# Patient Record
Sex: Male | Born: 1955 | Race: White | Hispanic: No | Marital: Married | Smoking: Former smoker
Health system: Southern US, Community
[De-identification: ages and names within clinical notes are randomized; demographics above are authoritative.]

## PROBLEM LIST (undated history)

## (undated) DIAGNOSIS — M5416 Radiculopathy, lumbar region: Secondary | ICD-10-CM

## (undated) DIAGNOSIS — I10 Essential (primary) hypertension: Secondary | ICD-10-CM

## (undated) DIAGNOSIS — I2510 Atherosclerotic heart disease of native coronary artery without angina pectoris: Secondary | ICD-10-CM

## (undated) DIAGNOSIS — Z87442 Personal history of urinary calculi: Secondary | ICD-10-CM

## (undated) DIAGNOSIS — J449 Chronic obstructive pulmonary disease, unspecified: Secondary | ICD-10-CM

## (undated) DIAGNOSIS — I219 Acute myocardial infarction, unspecified: Secondary | ICD-10-CM

## (undated) DIAGNOSIS — G709 Myoneural disorder, unspecified: Secondary | ICD-10-CM

## (undated) DIAGNOSIS — E785 Hyperlipidemia, unspecified: Secondary | ICD-10-CM

## (undated) DIAGNOSIS — J189 Pneumonia, unspecified organism: Secondary | ICD-10-CM

## (undated) DIAGNOSIS — H9190 Unspecified hearing loss, unspecified ear: Secondary | ICD-10-CM

## (undated) DIAGNOSIS — N189 Chronic kidney disease, unspecified: Secondary | ICD-10-CM

## (undated) DIAGNOSIS — R0600 Dyspnea, unspecified: Secondary | ICD-10-CM

## (undated) DIAGNOSIS — M1990 Unspecified osteoarthritis, unspecified site: Secondary | ICD-10-CM

## (undated) DIAGNOSIS — K219 Gastro-esophageal reflux disease without esophagitis: Secondary | ICD-10-CM

## (undated) DIAGNOSIS — I723 Aneurysm of iliac artery: Secondary | ICD-10-CM

## (undated) DIAGNOSIS — I739 Peripheral vascular disease, unspecified: Secondary | ICD-10-CM

## (undated) DIAGNOSIS — D649 Anemia, unspecified: Secondary | ICD-10-CM

## (undated) DIAGNOSIS — C801 Malignant (primary) neoplasm, unspecified: Secondary | ICD-10-CM

## (undated) HISTORY — PX: ANGIOPLASTY: SHX39

## (undated) HISTORY — PX: EYE SURGERY: SHX253

## (undated) HISTORY — PX: BACK SURGERY: SHX140

## (undated) HISTORY — PX: TOTAL SHOULDER REPLACEMENT: SUR1217

## (undated) HISTORY — PX: REPLACEMENT TOTAL KNEE: SUR1224

## (undated) HISTORY — PX: HERNIA REPAIR: SHX51

## (undated) HISTORY — PX: CARPAL TUNNEL RELEASE: SHX101

## (undated) HISTORY — PX: WRIST FUSION: SHX839

---

## 2010-05-22 ENCOUNTER — Emergency Department (HOSPITAL_BASED_OUTPATIENT_CLINIC_OR_DEPARTMENT_OTHER): Admission: EM | Admit: 2010-05-22 | Discharge: 2010-05-22 | Payer: Self-pay | Admitting: Emergency Medicine

## 2010-05-24 ENCOUNTER — Emergency Department (HOSPITAL_BASED_OUTPATIENT_CLINIC_OR_DEPARTMENT_OTHER): Admission: EM | Admit: 2010-05-24 | Discharge: 2010-05-24 | Payer: Self-pay | Admitting: Emergency Medicine

## 2013-11-11 HISTORY — PX: JOINT REPLACEMENT: SHX530

## 2014-12-10 ENCOUNTER — Emergency Department (HOSPITAL_BASED_OUTPATIENT_CLINIC_OR_DEPARTMENT_OTHER): Payer: BLUE CROSS/BLUE SHIELD

## 2014-12-10 ENCOUNTER — Emergency Department (HOSPITAL_BASED_OUTPATIENT_CLINIC_OR_DEPARTMENT_OTHER)
Admission: EM | Admit: 2014-12-10 | Discharge: 2014-12-10 | Disposition: A | Discharge status: Home / Self Care | Payer: BLUE CROSS/BLUE SHIELD | Attending: Emergency Medicine | Admitting: Emergency Medicine

## 2014-12-10 DIAGNOSIS — Z791 Long term (current) use of non-steroidal anti-inflammatories (NSAID): Secondary | ICD-10-CM | POA: Insufficient documentation

## 2014-12-10 DIAGNOSIS — W1839XA Other fall on same level, initial encounter: Secondary | ICD-10-CM | POA: Diagnosis not present

## 2014-12-10 DIAGNOSIS — Y9389 Activity, other specified: Secondary | ICD-10-CM | POA: Insufficient documentation

## 2014-12-10 DIAGNOSIS — S6991XA Unspecified injury of right wrist, hand and finger(s), initial encounter: Secondary | ICD-10-CM | POA: Diagnosis present

## 2014-12-10 DIAGNOSIS — Y998 Other external cause status: Secondary | ICD-10-CM | POA: Insufficient documentation

## 2014-12-10 DIAGNOSIS — S60221A Contusion of right hand, initial encounter: Secondary | ICD-10-CM | POA: Insufficient documentation

## 2014-12-10 DIAGNOSIS — Y9289 Other specified places as the place of occurrence of the external cause: Secondary | ICD-10-CM | POA: Diagnosis not present

## 2014-12-10 MED ORDER — LIDOCAINE HCL 2 % IJ SOLN: 20.0000 mL | Freq: Once | INTRAMUSCULAR | Status: DC

## 2014-12-10 MED ORDER — HYDROCODONE-ACETAMINOPHEN 5-325 MG PO TABS
1.0000 | ORAL_TABLET | Freq: Once | ORAL | Status: AC
Start: 2014-12-10 — End: 2014-12-10
  Administered 2014-12-10: 1 via ORAL
  Filled 2014-12-10: qty 1

## 2014-12-10 MED ORDER — HYDROCODONE-ACETAMINOPHEN 5-325 MG PO TABS: ORAL_TABLET | ORAL | Status: DC

## 2014-12-10 NOTE — Discharge Instructions (Signed)
Rest, Ice intermittently (in the first 24-48 hours), Gentle compression with an Ace wrap, and elevate (Limb above the level of the heart)   Take up to 800mg  of ibuprofen (that is usually 4 over the counter pills)  3 times a day for 5 days. Take with food.  Take vicodin for breakthrough pain, do not drink alcohol, drive, care for children or do other critical tasks while taking vicodin.  Please follow with your primary care doctor in the next 2 days for a check-up. They must obtain records for further management.   Do not hesitate to return to the Emergency Department for any new, worsening or concerning symptoms.    Contusion A contusion is a deep bruise. Contusions happen when an injury causes bleeding under the skin. Signs of bruising include pain, puffiness (swelling), and discolored skin. The contusion may turn blue, purple, or yellow. HOME CARE   Put ice on the injured area.  Put ice in a plastic bag.  Place a towel between your skin and the bag.  Leave the ice on for 15-20 minutes, 03-04 times a day.  Only take medicine as told by your doctor.  Rest the injured area.  If possible, raise (elevate) the injured area to lessen puffiness. GET HELP RIGHT AWAY IF:   You have more bruising or puffiness.  You have pain that is getting worse.  Your puffiness or pain is not helped by medicine. MAKE SURE YOU:   Understand these instructions.  Will watch your condition.  Will get help right away if you are not doing well or get worse. Document Released: 04/15/2008 Document Revised: 01/20/2012 Document Reviewed: 09/02/2011 [facility] Patient Information 2015 [place], [place]. This information is not intended to replace advice given to you by your health care provider. Make sure you discuss any questions you have with your health care provider.

## 2014-12-10 NOTE — ED Notes (Signed)
Patient reports that he tripped over rock yesterday falling forward trying to catch self with right hand/wrist. Swelling and bruising noted

## 2014-12-10 NOTE — ED Provider Notes (Signed)
CSN: [id number]     Arrival date & time 12/10/14  1140 History   First MD Initiated Contact with Patient 12/10/14 1208     Chief Complaint  Patient presents with  . Hand Injury     (Consider location/radiation/quality/duration/timing/severity/associated sxs/prior Treatment) HPI   Roy Caldwell is a 59 y.o. male complaining of 8 out of 10 right hand pain status post mechanical slip and fall yesterday while tripping over a rock. Patient fell on outstretched hand. He denies any head trauma, LOC, cervicalgia he states that his low back which gives him chronic issues is slightly more uncomfortable than normal. Patient has been taking BC powder at home with little relief. Patient is right-hand-dominant. States pain is exacerbated by movement and palpation.  No past medical history on file. No past surgical history on file. No family history on file. History  Substance Use Topics  . Smoking status: Not on file  . Smokeless tobacco: Not on file  . Alcohol Use: Not on file    Review of Systems  10 systems reviewed and found to be negative, except as noted in the HPI.  Allergies  Review of patient's allergies indicates not on file.  Home Medications   Prior to Admission medications   Medication Sig Start Date End Date Taking? Authorizing Provider  diclofenac (CATAFLAM) 50 MG tablet Take 50 mg by mouth 2 (two) times daily.   Yes Historical Provider, MD  traMADol (ULTRAM) 50 MG tablet Take by mouth every 6 (six) hours as needed.   Yes Historical Provider, MD   BP 127/67 mmHg  Pulse 83  Temp(Src) 98.4 F (36.9 C) (Oral)  Resp 18  Wt 190 lb (86.183 kg)  SpO2 98% Physical Exam  Constitutional: He is oriented to person, place, and time. He appears well-developed and well-nourished. No distress.  HENT:  Head: Normocephalic and atraumatic.  Mouth/Throat: Oropharynx is clear and moist.  Eyes: Conjunctivae and EOM are normal. Pupils are equal, round, and reactive to light.  Neck:  Normal range of motion.  Cardiovascular: Normal rate, regular rhythm and intact distal pulses.   Pulmonary/Chest: Effort normal and breath sounds normal. No stridor. No respiratory distress. He has no wheezes. He has no rales. He exhibits no tenderness.  Abdominal: Soft. Bowel sounds are normal. He exhibits no distension and no mass. There is no tenderness. There is no rebound and no guarding.  Musculoskeletal: Normal range of motion. He exhibits edema and tenderness.  Patient has swelling and tenderness palpation along the mid shaft of the right third and fourth metacarpal. Excellent range of motion to wrist.  No snuffbox TTP. NVI  Neurological: He is alert and oriented to person, place, and time.  Psychiatric: He has a normal mood and affect.  Nursing note and vitals reviewed.   ED Course  Procedures (including critical care time) Labs Review Labs Reviewed - No data to display  Imaging Review Dg Wrist Complete Right  12/10/2014   CLINICAL DATA:  Tripped over rock, and injured right wrist. Posterior wrist pain and swelling. Initial encounter.  EXAM: RIGHT WRIST - COMPLETE 3+ VIEW  COMPARISON:  None.  FINDINGS: There is no evidence of fracture or dislocation. The carpal rows are intact, and demonstrate normal alignment. The joint spaces are preserved. Mild positive ulnar variance is noted.  Dorsal soft tissue swelling is noted at the wrist.  IMPRESSION: No evidence of fracture or dislocation. Mild positive ulnar variance is noted.   Electronically Signed   By: Beryle Beams.D.  On: 12/10/2014 12:08   Dg Hand Complete Right  12/10/2014   CLINICAL DATA:  Patient reports that he tripped over a rock yesterday falling forward trying to catch self with right hand/wrist. C/o posterior pain with swelling.  EXAM: RIGHT HAND - COMPLETE 3+ VIEW  COMPARISON:  None.  FINDINGS: No convincing acute fracture.  No dislocation.  There arthropathic changes most prominent at the third metacarpophalangeal joint.  There is marked joint space narrowing and marginal osteophytes with mild palmar subluxation of the proximal phalanx in relation to the third metacarpal head. On the lateral view there is some cortical irregularity which is felt most likely to be spurring. If there is point tenderness in this location, could potentially reflect a fracture.  Milder arthropathic changes are noted at the first, second and fourth metacarpophalangeal joints and minimally involving the third through fifth finger DIP joints.  Soft tissues are unremarkable.  IMPRESSION: 1. No definite acute fracture. Mild cortical irregularity along the dorsal margin of the third metacarpal head is felt most likely to be due to spurring. Consider nondisplaced fracture if there is point tenderness in this location. 2. Arthropathic changes as described most prominent at the third metacarpophalangeal joint.   Electronically Signed   By: Amie Portlandavid  Ormond M.D.   On: 12/10/2014 12:10     EKG Interpretation None      MDM   Final diagnoses:  Hand contusion, right, initial encounter    Filed Vitals:   12/10/14 1154  BP: 127/67  Pulse: 83  Temp: 98.4 F (36.9 C)  TempSrc: Oral  Resp: 18  Weight: 190 lb (86.183 kg)  SpO2: 98%    Medications  HYDROcodone-acetaminophen (NORCO/VICODIN) 5-325 MG per tablet 1 tablet (1 tablet Oral Given 12/10/14 1224)    Roy Caldwell is a pleasant 59 y.o. male presenting with dominant hand pain status post slip and fall yesterday. Patient is neurovascularly intact. X-ray with no definite findings. There is an abnormality on the x-ray but this is not in the area of his pain. Patient states that this is where he has chronic issues with arthritis. Patient will be put in a wrist splint that covers the area of abnormality on the x-ray, I have advised our ICD. I will have him follow with Dr. Butler DenmarkGrammig for checkup  Evaluation does not show pathology that would require ongoing emergent intervention or inpatient  treatment. Pt is hemodynamically stable and mentating appropriately. Discussed findings and plan with patient/guardian, who agrees with care plan. All questions answered. Return precautions discussed and outpatient follow up given.   New Prescriptions   HYDROCODONE-ACETAMINOPHEN (NORCO/VICODIN) 5-325 MG PER TABLET    Take 1-2 tablets by mouth every 6 hours as needed for pain and/or cough.         Wynetta Emeryicole Cianna Kasparian, PA-C 12/10/14 1246  Ethelda ChickMartha K Linker, MD 12/10/14 1247

## 2016-01-16 IMAGING — CR DG WRIST COMPLETE 3+V*R*
4 series · 4 of 4 positions shown · non-contrast
Comparison: None.

CLINICAL DATA: Tripped over rock, and injured right wrist.
Posterior wrist pain and swelling. Initial encounter.

EXAM:
RIGHT WRIST - COMPLETE 3+ VIEW

[x wrist pa right]
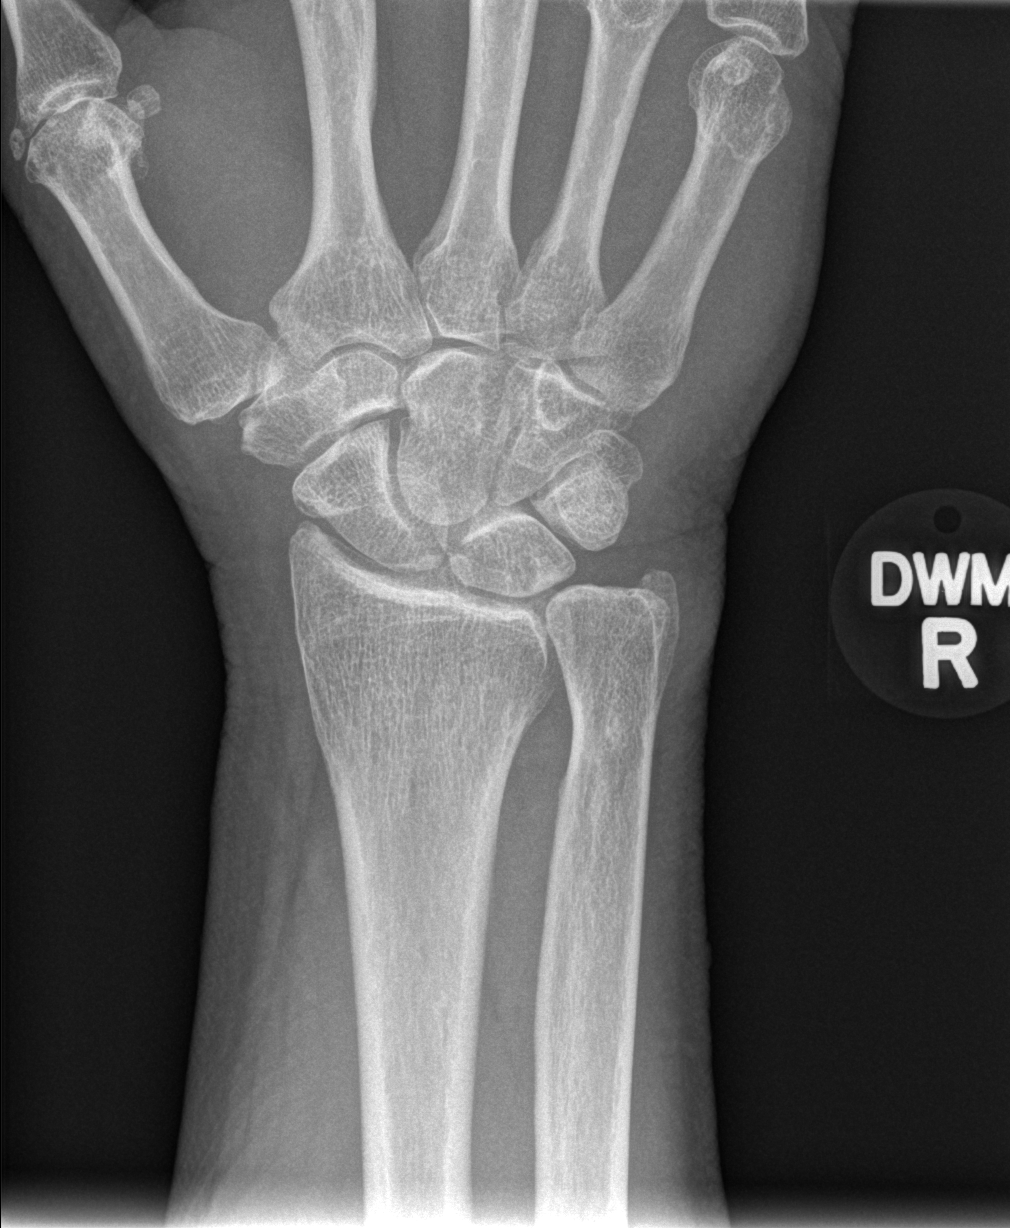

[x wrist obl right]
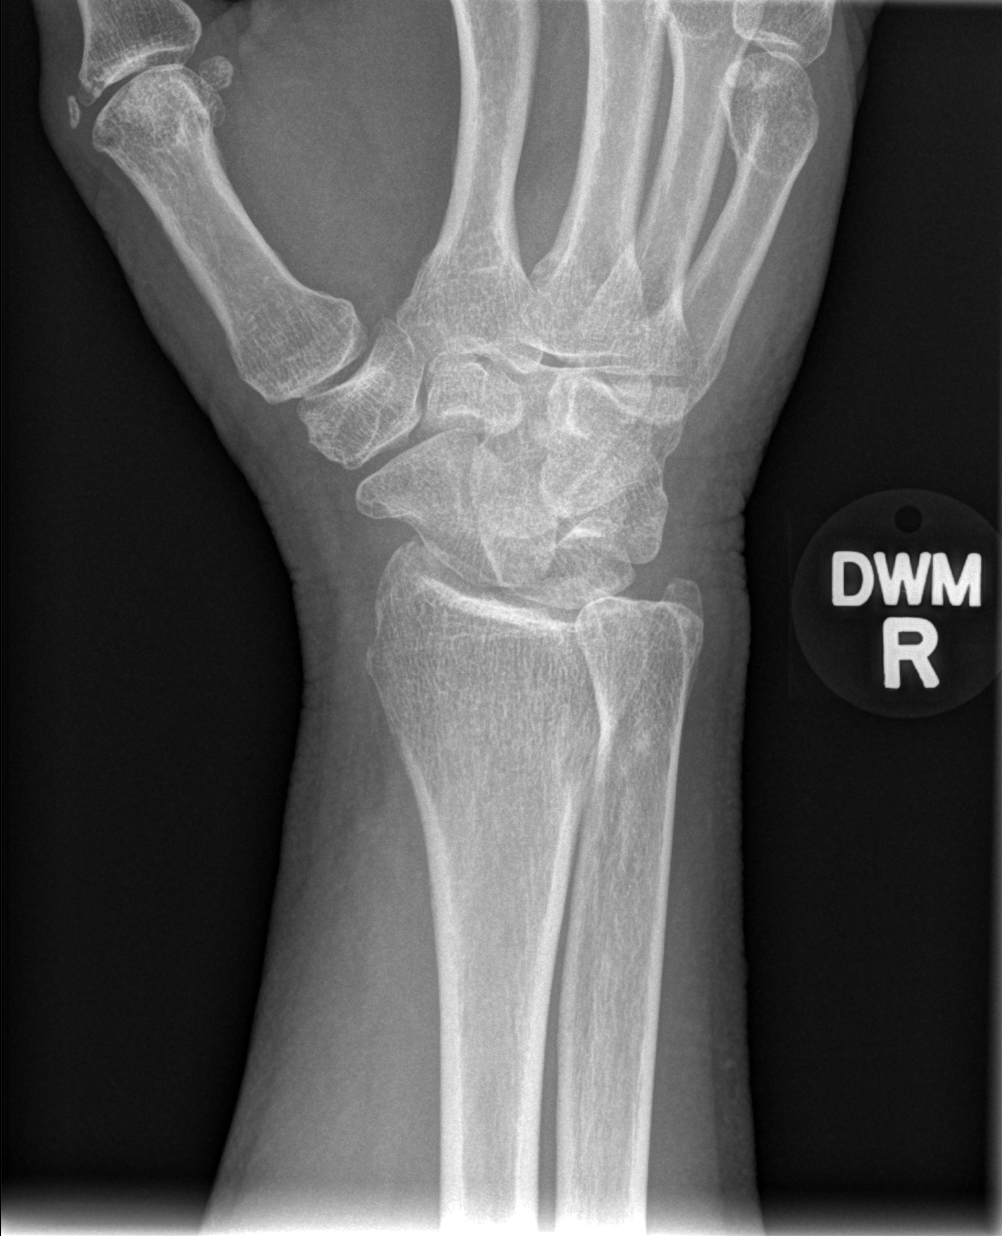

[x wrist lat right]
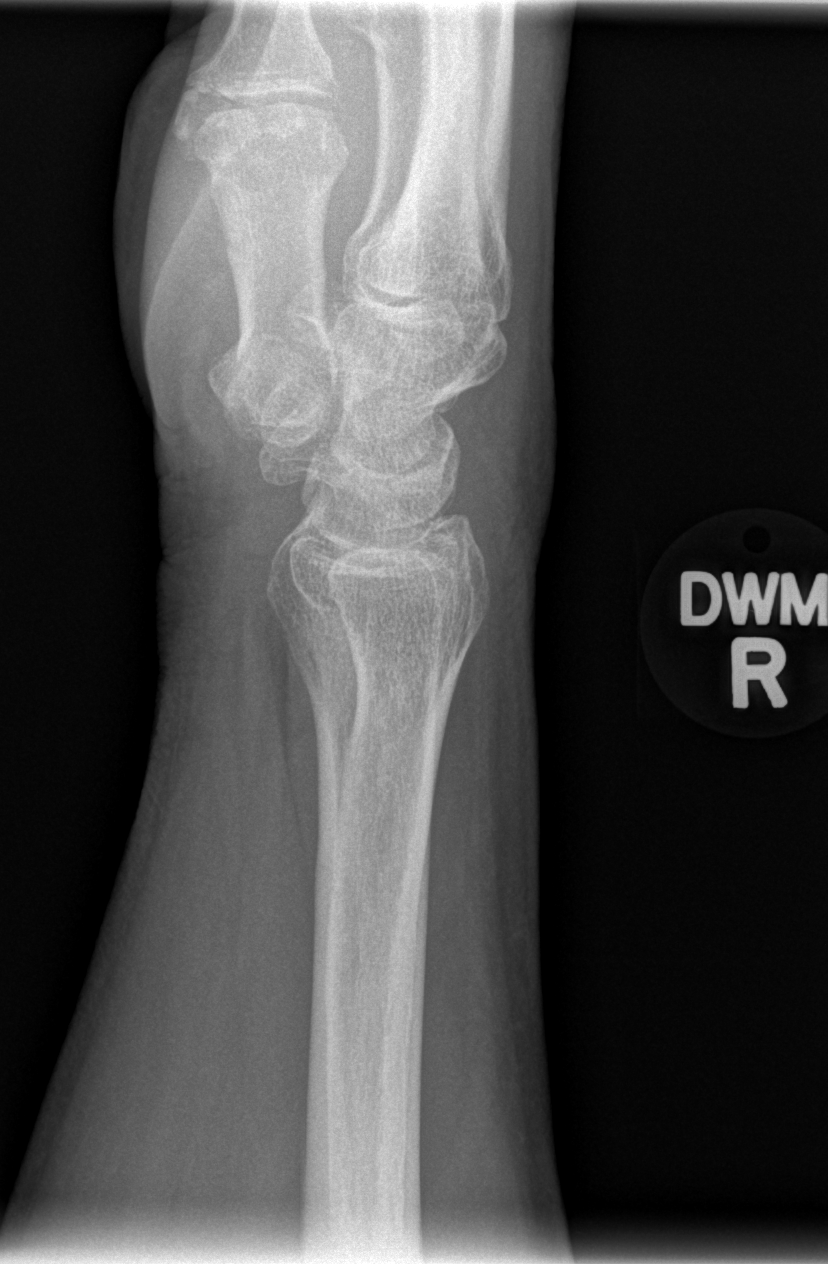

[x navicular]
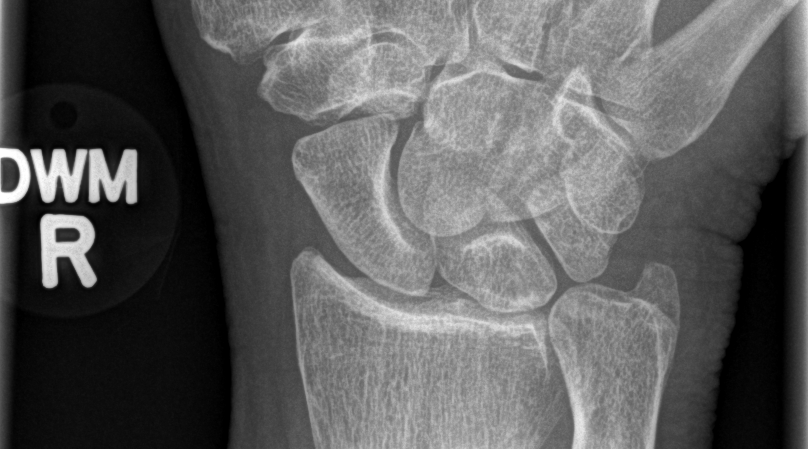

[4 of 4 positions shown; findings below may reference images not displayed]

FINDINGS: There is no evidence of fracture or dislocation. The carpal rows are
intact, and demonstrate normal alignment. The joint spaces are
preserved. Mild positive ulnar variance is noted.

Dorsal soft tissue swelling is noted at the wrist.
IMPRESSION: No evidence of fracture or dislocation. Mild positive ulnar variance
is noted.

## 2017-02-11 ENCOUNTER — Other Ambulatory Visit: Payer: Self-pay | Admitting: Neurosurgery

## 2017-02-12 ENCOUNTER — Other Ambulatory Visit: Payer: Self-pay | Admitting: Neurosurgery

## 2017-02-12 DIAGNOSIS — M533 Sacrococcygeal disorders, not elsewhere classified: Secondary | ICD-10-CM

## 2017-02-13 ENCOUNTER — Ambulatory Visit (INDEPENDENT_AMBULATORY_CARE_PROVIDER_SITE_OTHER): Payer: BLUE CROSS/BLUE SHIELD

## 2017-02-13 DIAGNOSIS — M533 Sacrococcygeal disorders, not elsewhere classified: Secondary | ICD-10-CM

## 2017-02-17 ENCOUNTER — Ambulatory Visit (INDEPENDENT_AMBULATORY_CARE_PROVIDER_SITE_OTHER): Payer: BLUE CROSS/BLUE SHIELD

## 2017-02-17 ENCOUNTER — Other Ambulatory Visit: Payer: BLUE CROSS/BLUE SHIELD

## 2017-02-17 DIAGNOSIS — M48061 Spinal stenosis, lumbar region without neurogenic claudication: Secondary | ICD-10-CM | POA: Diagnosis not present

## 2017-02-17 DIAGNOSIS — M533 Sacrococcygeal disorders, not elsewhere classified: Secondary | ICD-10-CM

## 2017-02-17 MED ORDER — GADOBENATE DIMEGLUMINE 529 MG/ML IV SOLN
20.0000 mL | Freq: Once | INTRAVENOUS | Status: AC | PRN
  Administered 2017-02-17: 18 mL via INTRAVENOUS

## 2017-02-19 ENCOUNTER — Other Ambulatory Visit: Payer: Self-pay | Admitting: Neurosurgery

## 2017-02-19 DIAGNOSIS — M533 Sacrococcygeal disorders, not elsewhere classified: Secondary | ICD-10-CM

## 2017-03-10 ENCOUNTER — Ambulatory Visit (INDEPENDENT_AMBULATORY_CARE_PROVIDER_SITE_OTHER): Payer: BLUE CROSS/BLUE SHIELD

## 2017-03-10 DIAGNOSIS — M533 Sacrococcygeal disorders, not elsewhere classified: Secondary | ICD-10-CM | POA: Diagnosis not present

## 2017-03-10 DIAGNOSIS — N400 Enlarged prostate without lower urinary tract symptoms: Secondary | ICD-10-CM

## 2017-10-08 MED ORDER — ONDANSETRON HCL 4 MG/2ML IJ SOLN
4.00 mg | INTRAMUSCULAR | Status: DC
Start: ? — End: 2017-10-08

## 2017-10-08 MED ORDER — HYDROCODONE-ACETAMINOPHEN 5-325 MG PO TABS
1.00 | ORAL_TABLET | ORAL | Status: DC
Start: ? — End: 2017-10-08

## 2017-10-08 MED ORDER — OXYCODONE-ACETAMINOPHEN 5-325 MG PO TABS
2.00 | ORAL_TABLET | ORAL | Status: DC
Start: ? — End: 2017-10-08

## 2017-10-08 MED ORDER — GENERIC EXTERNAL MEDICATION
1.00 | Status: DC
Start: ? — End: 2017-10-08

## 2017-10-08 MED ORDER — ALUMINUM-MAGNESIUM-SIMETHICONE 200-200-20 MG/5ML PO SUSP
30.00 mL | ORAL | Status: DC
Start: ? — End: 2017-10-08

## 2017-10-08 MED ORDER — SODIUM CHLORIDE 0.9 % IV SOLN
INTRAVENOUS | Status: DC
Start: ? — End: 2017-10-08

## 2017-10-08 MED ORDER — BISACODYL 10 MG RE SUPP
10.00 mg | RECTAL | Status: DC
Start: ? — End: 2017-10-08

## 2017-10-08 MED ORDER — ASPIRIN 81 MG PO CHEW
81.00 mg | CHEWABLE_TABLET | ORAL | Status: DC
Start: 2017-10-09 — End: 2017-10-08

## 2017-10-08 MED ORDER — CYCLOBENZAPRINE HCL 10 MG PO TABS
5.00 mg | ORAL_TABLET | ORAL | Status: DC
Start: ? — End: 2017-10-08

## 2017-10-08 MED ORDER — ROPIVACAINE HCL 2 MG/ML IJ SOLN
INTRAMUSCULAR | Status: DC
Start: ? — End: 2017-10-08

## 2017-10-08 MED ORDER — SENNOSIDES-DOCUSATE SODIUM 8.6-50 MG PO TABS
2.00 | ORAL_TABLET | ORAL | Status: DC
Start: 2017-10-08 — End: 2017-10-08

## 2017-10-08 MED ORDER — PANTOPRAZOLE SODIUM 40 MG PO TBEC
40.00 mg | DELAYED_RELEASE_TABLET | ORAL | Status: DC
Start: 2017-10-09 — End: 2017-10-08

## 2019-03-08 ENCOUNTER — Other Ambulatory Visit: Payer: Self-pay

## 2019-03-08 ENCOUNTER — Emergency Department (HOSPITAL_BASED_OUTPATIENT_CLINIC_OR_DEPARTMENT_OTHER): Payer: Self-pay

## 2019-03-08 ENCOUNTER — Emergency Department (HOSPITAL_BASED_OUTPATIENT_CLINIC_OR_DEPARTMENT_OTHER)
Admission: EM | Admit: 2019-03-08 | Discharge: 2019-03-08 | Disposition: A | Discharge status: Home / Self Care | Payer: Self-pay | Attending: Emergency Medicine | Admitting: Emergency Medicine

## 2019-03-08 ENCOUNTER — Encounter (HOSPITAL_BASED_OUTPATIENT_CLINIC_OR_DEPARTMENT_OTHER): Payer: Self-pay | Admitting: *Deleted

## 2019-03-08 DIAGNOSIS — Y929 Unspecified place or not applicable: Secondary | ICD-10-CM | POA: Insufficient documentation

## 2019-03-08 DIAGNOSIS — Z79899 Other long term (current) drug therapy: Secondary | ICD-10-CM | POA: Insufficient documentation

## 2019-03-08 DIAGNOSIS — I2510 Atherosclerotic heart disease of native coronary artery without angina pectoris: Secondary | ICD-10-CM | POA: Insufficient documentation

## 2019-03-08 DIAGNOSIS — Z96659 Presence of unspecified artificial knee joint: Secondary | ICD-10-CM | POA: Insufficient documentation

## 2019-03-08 DIAGNOSIS — S41112A Laceration without foreign body of left upper arm, initial encounter: Secondary | ICD-10-CM

## 2019-03-08 DIAGNOSIS — Z96619 Presence of unspecified artificial shoulder joint: Secondary | ICD-10-CM | POA: Insufficient documentation

## 2019-03-08 DIAGNOSIS — S51812A Laceration without foreign body of left forearm, initial encounter: Secondary | ICD-10-CM | POA: Insufficient documentation

## 2019-03-08 DIAGNOSIS — F17200 Nicotine dependence, unspecified, uncomplicated: Secondary | ICD-10-CM | POA: Insufficient documentation

## 2019-03-08 DIAGNOSIS — Y999 Unspecified external cause status: Secondary | ICD-10-CM | POA: Insufficient documentation

## 2019-03-08 DIAGNOSIS — W312XXA Contact with powered woodworking and forming machines, initial encounter: Secondary | ICD-10-CM | POA: Insufficient documentation

## 2019-03-08 DIAGNOSIS — Z7902 Long term (current) use of antithrombotics/antiplatelets: Secondary | ICD-10-CM | POA: Insufficient documentation

## 2019-03-08 DIAGNOSIS — Y9389 Activity, other specified: Secondary | ICD-10-CM | POA: Insufficient documentation

## 2019-03-08 HISTORY — DX: Atherosclerotic heart disease of native coronary artery without angina pectoris: I25.10

## 2019-03-08 MED ORDER — CEPHALEXIN 500 MG PO CAPS: 500.0000 mg | ORAL_CAPSULE | Freq: Four times a day (QID) | ORAL | 0 refills | Status: AC

## 2019-03-08 MED ORDER — LIDOCAINE-EPINEPHRINE (PF) 2 %-1:200000 IJ SOLN
20.0000 mL | Freq: Once | INTRAMUSCULAR | Status: AC
  Administered 2019-03-08: 20 mL via INTRADERMAL
  Filled 2019-03-08: qty 20

## 2019-03-08 MED ORDER — HYDROCODONE-ACETAMINOPHEN 5-325 MG PO TABS: 1.0000 | ORAL_TABLET | Freq: Four times a day (QID) | ORAL | 0 refills | Status: DC | PRN

## 2019-03-08 MED ORDER — BACITRACIN ZINC 500 UNIT/GM EX OINT
TOPICAL_OINTMENT | Freq: Once | CUTANEOUS | Status: AC
  Administered 2019-03-08: 1 via TOPICAL
  Filled 2019-03-08: qty 28.35

## 2019-03-08 MED ORDER — CEPHALEXIN 250 MG PO CAPS
500.0000 mg | ORAL_CAPSULE | Freq: Once | ORAL | Status: AC
  Administered 2019-03-08: 19:00:00 500 mg via ORAL
  Filled 2019-03-08: qty 2

## 2019-03-08 MED ORDER — HYDROCODONE-ACETAMINOPHEN 5-325 MG PO TABS
1.0000 | ORAL_TABLET | Freq: Once | ORAL | Status: AC
  Administered 2019-03-08: 1 via ORAL
  Filled 2019-03-08: qty 1

## 2019-03-08 MED ORDER — LIDOCAINE-EPINEPHRINE (PF) 2 %-1:200000 IJ SOLN
INTRAMUSCULAR | Status: AC
  Administered 2019-03-08: 10 mL
  Filled 2019-03-08: qty 10

## 2019-03-08 NOTE — ED Triage Notes (Signed)
Pt c/o lac to left arm by power saw, bleeding noted pressure applied

## 2019-03-08 NOTE — ED Notes (Signed)
ED Provider at bedside. 

## 2019-03-08 NOTE — Discharge Instructions (Signed)
Keep the wound clean and dry for the first 24 hours. After that you may gently clean the wound with soap and water. Make sure to pat dry the wound before covering it with any dressing. You can use topical antibiotic ointment and bandage. Ice and elevate for pain relief.   You can take Tylenol or Ibuprofen as directed for pain. You can alternate Tylenol and Ibuprofen every 4 hours for additional pain relief.   Take pain medications as directed for break through pain. Do not drive or operate machinery while taking this medication.   Take antibiotics as directed. Please take all of your antibiotics until finished.  Return to the Emergency Department, your primary care doctor, or the [facility] in 7-10 days for suture removal.   Monitor closely for any signs of infection. Return to the Emergency Department for any worsening redness/swelling of the area that begins to spread, drainage from the site, worsening pain, fever or any other worsening or concerning symptoms.

## 2019-03-08 NOTE — ED Provider Notes (Signed)
[facility] EMERGENCY DEPARTMENT Provider Note   CSN: [id number] Arrival date & time: 03/08/19  1628    History   Chief Complaint Chief Complaint  Patient presents with  . Laceration    HPI Roy Caldwell is a 63 y.o. male with PMH/o CAD who presents for evaluation of left upper extremity arm laceration that occurred approximately half hour prior to ED arrival.  Patient reports he was using a table or circular saw and states that it slipped and jumped and caused a laceration of his left forearm.  He states his last tetanus was 3 years ago.  He is currently on Plavix.  Patient reports he has been able to move the arm with any difficulty and denies any numbness/weakness.     The history is provided by the patient.    Past Medical History:  Diagnosis Date  . CAD (coronary artery disease)     There are no active problems to display for this patient.   Past Surgical History:  Procedure Laterality Date  . ANGIOPLASTY    . BACK SURGERY    . REPLACEMENT TOTAL KNEE    . TOTAL SHOULDER REPLACEMENT          Home Medications    Prior to Admission medications   Medication Sig Start Date End Date Taking? Authorizing Provider  clopidogrel (PLAVIX) 75 MG tablet Take 75 mg by mouth daily.   Yes [provider]  cephALEXin (KEFLEX) 500 MG capsule Take 1 capsule (500 mg total) by mouth 4 (four) times daily for 7 days. 03/08/19 03/15/19  Maxwell CaulLayden, Hason Ofarrell A, PA-C  diclofenac (CATAFLAM) 50 MG tablet Take 50 mg by mouth 2 (two) times daily.    [provider]  HYDROcodone-acetaminophen (NORCO/VICODIN) 5-325 MG tablet Take 1-2 tablets by mouth every 6 (six) hours as needed. 03/08/19   Maxwell CaulLayden, Riccardo Holeman A, PA-C  traMADol (ULTRAM) 50 MG tablet Take by mouth every 6 (six) hours as needed.    [provider]    Family History History reviewed. No pertinent family history.  Social History Social History   Tobacco Use  . Smoking status: Current Every Day  Smoker    Packs/day: 0.50  . Smokeless tobacco: Never Used  Substance Use Topics  . Alcohol use: Not Currently  . Drug use: Not Currently     Allergies   Shellfish allergy and Statins   Review of Systems Review of Systems  Skin: Positive for wound.  Neurological: Negative for weakness and numbness.  All other systems reviewed and are negative.    Physical Exam Updated Vital Signs BP (!) 147/74 (BP Location: Right Arm)   Pulse 76   Temp 98.1 F (36.7 C) (Oral)   Resp 18   Ht 5\' 8"  (1.727 m)   Wt 86.2 kg   SpO2 99%   BMI 28.89 kg/m   Physical Exam Vitals signs and nursing note reviewed.  Constitutional:      Appearance: He is well-developed.  HENT:     Head: Normocephalic and atraumatic.  Eyes:     General: No scleral icterus.       Right eye: No discharge.        Left eye: No discharge.     Conjunctiva/sclera: Conjunctivae normal.  Cardiovascular:     Pulses:          Radial pulses are 2+ on the right side and 2+ on the left side.  Pulmonary:     Effort: Pulmonary effort is normal.  Skin:  General: Skin is warm and dry.     Capillary Refill: Capillary refill takes less than 2 seconds.     Comments: 7 cm jagged slightly L-shaped laceration noted to the anterior lateral aspect of the left forearm. Good distal cap refill. LUE is not dusky in appearance or cool to touch.  Neurological:     Mental Status: He is alert.  Psychiatric:        Speech: Speech normal.        Behavior: Behavior normal.          ED Treatments / Results  Labs (all labs ordered are listed, but only abnormal results are displayed) Labs Reviewed - No data to display  EKG None  Radiology Dg Forearm Left  Result Date: 03/08/2019 CLINICAL DATA:  Electric saw laceration of the left forearm. Bleeding. EXAM: LEFT FOREARM - 2 VIEW COMPARISON:  None. FINDINGS: No fracture or acute bony findings. No elbow joint effusion. Bandaging along the forearm noted. No foreign body is  observed. IMPRESSION: 1. No bony involvement or unexpected foreign body identified. Electronically Signed   By: Gaylyn Rong M.D.   On: 03/08/2019 18:21    Procedures .Marland KitchenLaceration Repair Date/Time: 03/08/2019 7:14 PM Performed by: Maxwell Caul, PA-C Authorized by: Maxwell Caul, PA-C   Consent:    Consent obtained:  Verbal   Consent given by:  Patient   Risks discussed:  Infection, pain, retained foreign body, poor cosmetic result and poor wound healing Anesthesia (see MAR for exact dosages):    Anesthesia method:  Local infiltration   Local anesthetic:  Lidocaine 2% WITH epi Laceration details:    Location:  Shoulder/arm   Shoulder/arm location:  L lower arm   Length (cm):  7 Repair type:    Repair type:  Intermediate Pre-procedure details:    Preparation:  Patient was prepped and draped in usual sterile fashion Exploration:    Hemostasis achieved with:  Direct pressure   Wound exploration: wound explored through full range of motion     Wound extent: foreign bodies/material     Wound extent: no muscle damage noted and no tendon damage noted     Foreign bodies/material:  Dirt and debris Treatment:    Area cleansed with:  Betadine   Amount of cleaning:  Extensive   Irrigation solution:  Sterile saline   Irrigation method:  Syringe   Visualized foreign bodies/material removed: yes   Skin repair:    Repair method:  Sutures   Suture size:  3-0   Suture material:  Nylon   Suture technique:  Simple interrupted   Number of sutures:  13 Approximation:    Approximation:  Close Post-procedure details:    Dressing:  Antibiotic ointment and non-adherent dressing   Patient tolerance of procedure:  Tolerated well, no immediate complications Comments:     Once the wound was anesthetized, was thoroughly extensively irrigated with sterile saline.  Review of the wound showed no evidence of tendon damage, muscle damage.  Muscle body was fully extensively irrigated and  evaluated with no signs of defect.  There was small amount of dirt debris that was removed and the area was thoroughly and extensively irrigated again.    (including critical care time)  Medications Ordered in ED Medications  lidocaine-EPINEPHrine (XYLOCAINE W/EPI) 2 %-1:200000 (PF) injection 20 mL (20 mLs Intradermal Given by Other 03/08/19 1744)  lidocaine-EPINEPHrine (XYLOCAINE W/EPI) 2 %-1:200000 (PF) injection (10 mLs  Given 03/08/19 1917)  HYDROcodone-acetaminophen (NORCO/VICODIN) 5-325 MG per tablet 1  tablet (1 tablet Oral Given 03/08/19 1832)  cephALEXin (KEFLEX) capsule 500 mg (500 mg Oral Given 03/08/19 1916)  bacitracin ointment (1 application Topical Given 03/08/19 1917)     Initial Impression / Assessment and Plan / ED Course  I have reviewed the triage vital signs and the nursing notes.  Pertinent labs & imaging results that were available during my care of the patient were reviewed by me and considered in my medical decision making (see chart for details).        63 year old male who presents for evaluation of left upper extremity laceration that occurred 30 minutes prior to ED arrival.  He is on Plavix. Patient is afebrile, non-toxic appearing, sitting comfortably on examination table. Vital signs reviewed and stable. Patient is neurovascularly intact.  Tetanus is up-to-date.  Plan for wound care and x-ray imaging.  X-ray reviewed.  No evidence of bony abnormality.  Laceration repaired as documented above.  Patient tolerated procedure well.  Given small amount of debris removed from wound, will plan to put him on antibiotics for preventative care. Patient with no known drug allergies.  Additionally, given pain, patient given short course of pain medication.  Encouraged at home supportive care measures. At this time, patient exhibits no emergent life-threatening condition that require further evaluation in ED or admission. Patient had ample opportunity for questions and  discussion. All patient's questions were answered with full understanding. Strict return precautions discussed. Patient expresses understanding and agreement to plan.   Portions of this note were generated with Scientist, clinical (histocompatibility and immunogenetics). Dictation errors may occur despite best attempts at proofreading.    Final Clinical Impressions(s) / ED Diagnoses   Final diagnoses:  Laceration of left upper extremity, initial encounter    ED Discharge Orders         Ordered    cephALEXin (KEFLEX) 500 MG capsule  4 times daily     03/08/19 1911    HYDROcodone-acetaminophen (NORCO/VICODIN) 5-325 MG tablet  Every 6 hours PRN     03/08/19 1911           Rosana Hoes 03/08/19 2123    Rolan Bucco, MD 03/08/19 2230

## 2019-11-12 DIAGNOSIS — I639 Cerebral infarction, unspecified: Secondary | ICD-10-CM

## 2019-11-12 HISTORY — DX: Cerebral infarction, unspecified: I63.9

## 2020-04-13 IMAGING — DX LEFT FOREARM - 2 VIEW
2 series · 2 of 2 positions shown · non-contrast
Comparison: None.

CLINICAL DATA: Electric saw laceration of the left forearm.
Bleeding.

EXAM:
LEFT FOREARM - 2 VIEW

[forearm ap]
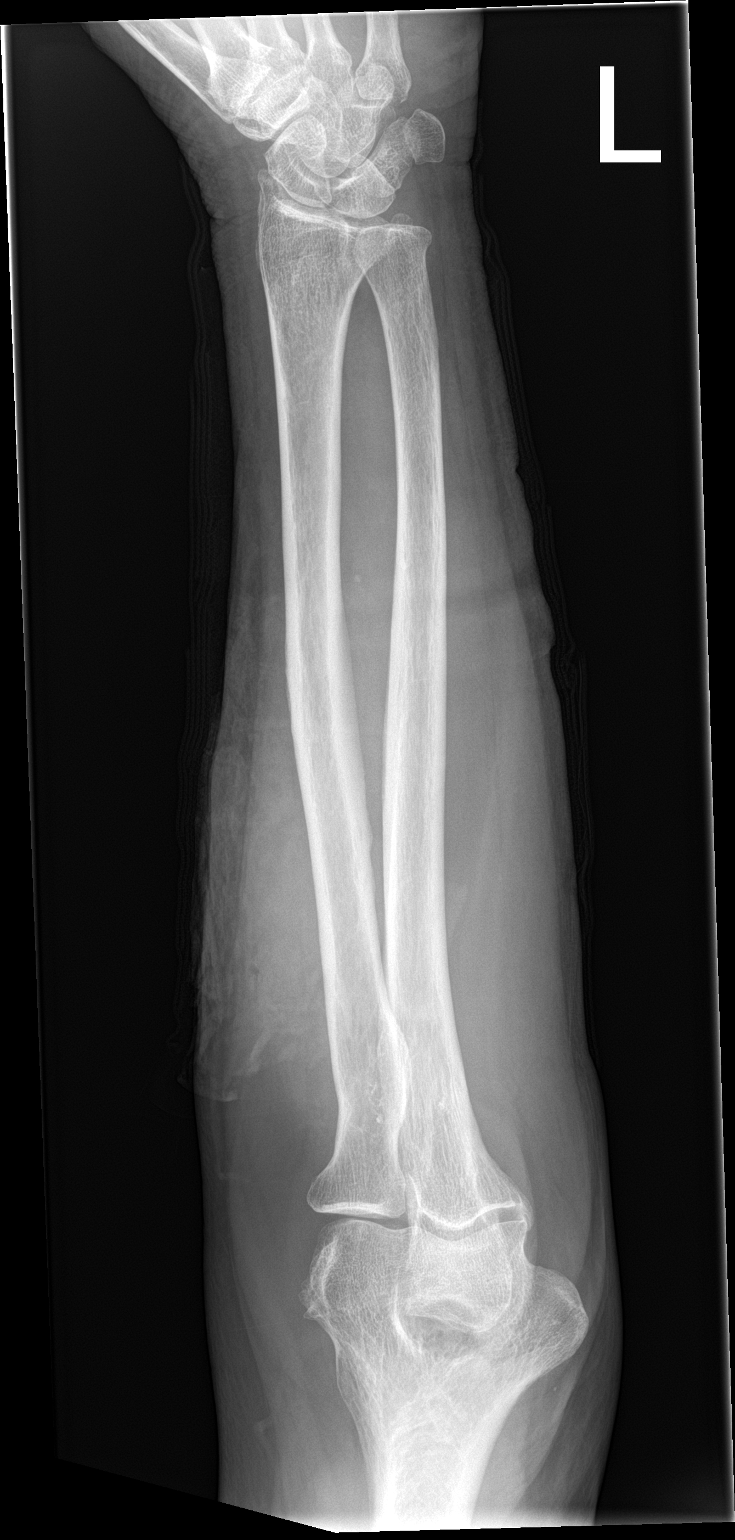

[forearm lat]
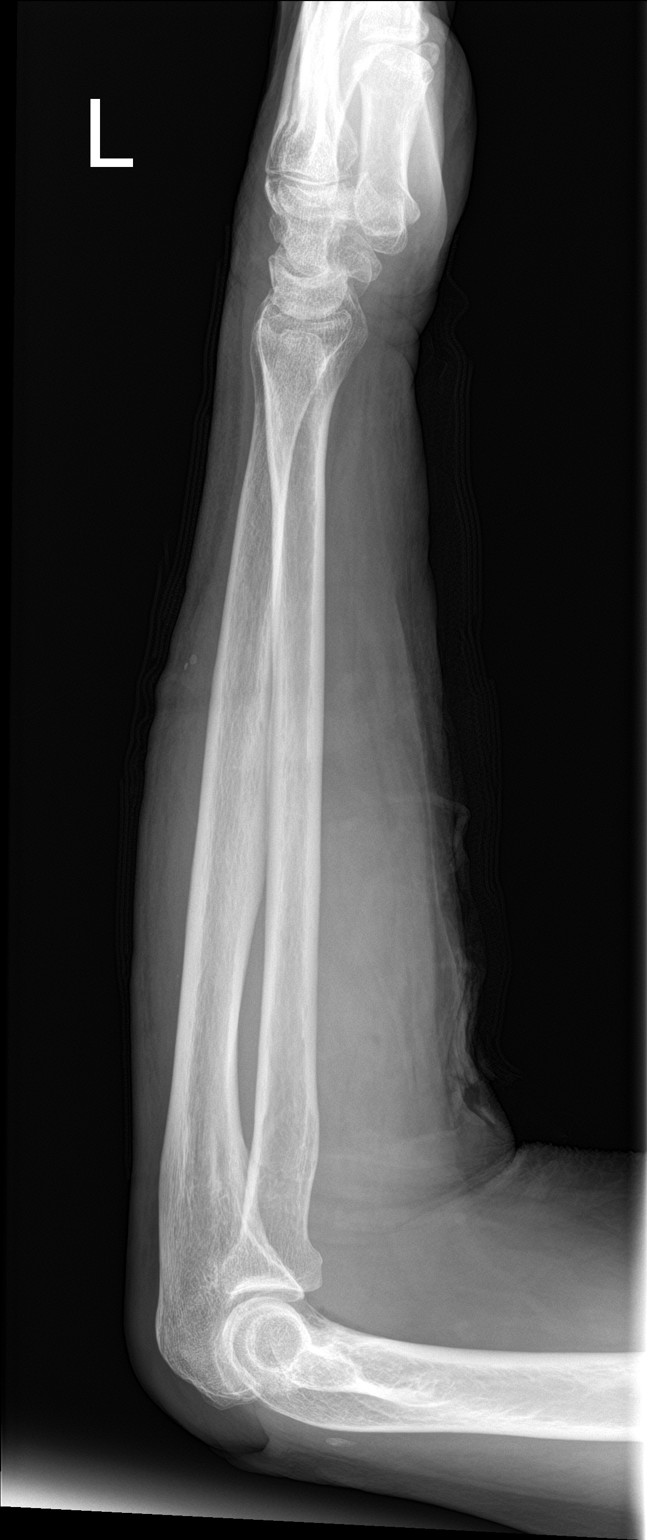

[2 of 2 positions shown; findings below may reference images not displayed]

FINDINGS: No fracture or acute bony findings. No elbow joint effusion.
Bandaging along the forearm noted. No foreign body is observed.
IMPRESSION: 1. No bony involvement or unexpected foreign body identified.

## 2020-11-11 HISTORY — PX: CARDIAC CATHETERIZATION: SHX172

## 2021-11-11 HISTORY — PX: TOTAL SHOULDER REPLACEMENT: SUR1217

## 2022-07-02 ENCOUNTER — Other Ambulatory Visit: Payer: Self-pay | Admitting: Orthopaedic Surgery

## 2022-07-02 DIAGNOSIS — Z01818 Encounter for other preprocedural examination: Secondary | ICD-10-CM

## 2022-07-24 ENCOUNTER — Other Ambulatory Visit: Payer: Self-pay

## 2024-06-22 ENCOUNTER — Encounter: Payer: Self-pay | Admitting: Orthopaedic Surgery

## 2024-06-22 ENCOUNTER — Other Ambulatory Visit: Payer: Self-pay | Admitting: Orthopaedic Surgery

## 2024-06-22 DIAGNOSIS — G8929 Other chronic pain: Secondary | ICD-10-CM

## 2024-06-25 ENCOUNTER — Ambulatory Visit
Admission: RE | Admit: 2024-06-25 | Discharge: 2024-06-25 | Disposition: A | Discharge status: Home / Self Care | Payer: Self-pay | Source: Ambulatory Visit | Attending: Orthopaedic Surgery | Admitting: Orthopaedic Surgery

## 2024-06-25 DIAGNOSIS — G8929 Other chronic pain: Secondary | ICD-10-CM

## 2024-07-01 NOTE — Progress Notes (Signed)
 [facility] - Heart and V[facility], [place] Office Note   Roy Caldwell DOB:  08-05-1956 MRN:  [id number]  07/01/2024 -- I last saw him on 03/11/2024          PCP:  Franky Ozell Molt, PA-C Referring Provider:  Franky Ozell Molt, PA-C  Return Patient   Chief Complaint  Patient presents with  . Follow-up  . Coronary Artery Disease    DES to LAD, RCA CTO by cath by Dr. Launie on 05/25/2021  . PAD    Follows with Vasc Surg  CAD, DES pLAD, CTO RCA 05/25/2021, PAD, aneurysm of L iliac art, R SFA procedure 2019, HTN, HLD  According to appointment information:    03/11/24 Claudene  Return in about 3 months (around 06/11/2024).    Background/HPI:          (03/10/2024) Roy Caldwell is a 68 yo  male with multiple medical problems as listed on his problem list including CAD, PAD with claudication, aneurysm of left common iliac artery, HTN, HLD, former smoker, COPD, GERD, lumbar radiculopathy, hearing loss, etc., referred for evaluation of blood flow in legs and pain with ambulation.  Review of EPIC indicates a portal message from Roy Caldwell 10/03/2024 indicating he was having weakness and pain in his legs and lower back for couple of months which appeared to be getting worse.  He was concerned about blood flow in his legs.  Franky Molt, PA-C advised follow-up with vascular.  Review of EPIC indicates he was seen by Leotis Guy on 10/28/2023.  He is also a previous patient of Dr. McGukin.  Her note indicates he has CAD with DES to proximal LAD, CTO RCA noted on cath 05/25/2021.  He was stable and having no chest pain.  He has PAD of bilateral lower extremities with intermittent claudication.  Right SFA procedure (angioplasty) in 2019, followed by vascular surgery.  He is on statin therapy.  It appears that his last visit with vascular surgery was with Leita Share, PA-C on 05/16/2023.  Further note indicates he had balloon angioplasty of the right SFA in 2019 for  nonhealing wound.  He underwent extensive back surgery 6 months prior.  He has a known iliac aneurysm.  He was on aspirin  and Plavix.  He was not having claudication.  ABIs 05/16/2023 showed: Right  Mildly reduced ankle and toe-brachial indices of the lower extremity, stable. Slightly reduced pulse volume recordings of the ankle, foot and digit. Left  Normal pressures, pulse volume recordings, ankle-brachial and toe-brachial index, stable.   03/11/2024:          (Initial clinic visit)                           BP: 104/62     HR: 71 ECG: Not done CV-related meds: Aspirin  81, clopidogrel 75, ezetimibe 10, Nitrostat, pravastatin 40.  Roy Caldwell came to clinic today accompanied by his wife.  We reviewed and they confirmed the above information.  I explained to him that if he was referred for evaluation of blood flow in his legs and pain with ambulation (claudication), I think he should see vascular surgery.  He had seen Leita Share, PA-C in the past.  He is willing to do this.  However, since he has a history of coronary disease with 2 stents in the LAD and has been previously followed by Dr. McGukin and Leotis Sella, then I am happy to see  him from a cardiac standpoint. Initially we talked about his claudication.  He states that he has leg pain and leg weakness.  He is concerned that the pravastatin is contributing to this.  Also, he has a lot of abdominal bloating and indigestion and wonders if the pravastatin could contribute to this as well.  To take this out of the equation and better evaluate him, I have recommended that he stop the pravastatin altogether for about a month and let's see if his symptoms improve.  He is not sure he wants to continue it anyway.  He will continue on ezetimibe.  I talked to him about Repatha and he is interested in trying this after the pravastatin has worn off. 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  He has no residual issues. He  states he had pain in his chest a couple of nights ago as well as a few weeks ago.  He was laying in bed watching TV and developed a tightness and a sharp pain in his chest which lingered.  He took 2 sublingual nitroglycerin 5 minutes apart and the pain resolved.  It did not radiate.  There were no associated symptoms.  He has not noticed any chest pain, tightness, heaviness with exertion, but he is barely able to walk due to the pain in his legs and his back problems, so he does not do any exertion whatsoever. He complains of fatigue, his legs being tired, back pain, abdominal bloating.  I referred him to vascular surgery for evaluation of claudication, ordered Lexiscan monitor, consider Repatha in the future.  Zio patch monitor showed 6% PACs and I recommended metoprolol tartrate 12.5 BID.  Lexiscan 03/24/2024 showed small apical/apical septal infarct, no ischemia, normal LV wall motion, EF 76%.   He saw Leita Tasted, PA-C in vascular surgery on 04/07/2024 and she restarted his Pletal.  07/01/2024: Second clinic visit with me BP: 116/63   HR: 97 ECG: Not done CV-related meds: Aspirin  81, cilostazol 50 BID, clopidogrel 75, ezetimibe 10, Nitrostat, pravastatin 40.  We held the pravastatin to see if it helped his leg pain.  We were going to consider Repatha. FLP 03/01/2024: TC 155, TG 263, HDL 45, LDL 75.  Roy Caldwell came to clinic today alone.  He states he is doing well.  No cardiac symptoms.  No CP, SOB, DOE, PND, orthopnea, peripheral edema, palpitations, dizziness, presyncope, syncope. He states he did hold the pravastatin but it really did not make any difference in his leg pain and Leita Test, PA-C in Vascular Surgery restarted it.  She also put him on cilostazol.  He continues on aspirin  and clopidogrel as well.  I think due to the extent of his PAD we will continue all three.  If Ms. Tasted wants to make changes the next time she sees him that is fine.  He will continue the pravastatin and  the ezetimibe.  Last LDL was 75.  I do not think he needs Repatha. Monitor showed 6% PACs and I suggested metoprolol but he is not on it.  BP today is 116/63.  He is not having any palpitations, is completely unaware of the PACs, and I did not hear any today, so will not start the metoprolol.   No testing.  No changes.  F/u in 6 mo.   Active Problem List: Patient Active Problem List  Diagnosis  . Aftercare following left knee joint replacement surgery  . Anxiety state  . Arthropathy  .  At risk for renal compromise  . Atherosclerotic vascular disease  . Bursitis disorder  . Cramp of limb  . Effusion of left knee  . Mixed hyperlipidemia  . Left-sided low back pain without sciatica  . Leg cramps  . Lumbar canal stenosis  . Lumbar radiculopathy  . Osteoarthrosis, shoulder region  . Primary localized osteoarthrosis, lower leg  . Primary osteoarthritis of left knee  . Sciatica  . Stenosis of cervical spine with myelopathy (HCC)  . Tobacco abuse counseling  . Trochanteric bursitis, right hip  . Aftercare following right shoulder joint replacement surgery  . Wears glasses  . Ulcer of toe (HCC)  . Rotator cuff tear arthropathy of right shoulder  . Kidney stones  . Hearing loss of left ear  . GERD (gastroesophageal reflux disease)  . DDD (degenerative disc disease), lumbar  . DDD (degenerative disc disease), cervical  . Organic impotence  . Premature ejaculation  . Tobacco abuse  . 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 Atherosclerosis of native artery of extremity with intermittent claudication (HCC)  . Stroke determined by clinical assessment (HCC)  . Varicose veins of left lower extremity with inflammation, with ulcer of ankle limited to breakdown of skin (HCC)  . Long-term use of aspirin  therapy  . Current use of long term anticoagulation  . Coronary artery disease involving native coronary artery of native heart without angina pectoris  . Carpal tunnel syndrome of  right wrist  . BPH with obstruction/lower urinary tract symptoms  . Left flank pain  . Osteoarthritis of right knee  . Degenerative scoliosis  . Thoracic degenerative disc disease  . Status post lumbar laminectomy  . Status post lumbar spinal fusion -  Laminotomy of L1, laminectomy of L2, revision laminectomy L3 and L4, laminectomy of L5, posterior spinal fusion T10 to sacrum /pelvis 12/02/22  . Left hip pain  . Wrist arthritis  . Carpal tunnel syndrome of left wrist  . Rotator cuff tear arthropathy of left shoulder  . Left wrist pain  . Wrist stiffness, left  . Weakness of left shoulder  . Aftercare following surgery of the musculoskeletal system  . Former smoker  . Chronic obstructive pulmonary disease    (CMD)  . History of coronary artery stent placement  . Prediabetes      Medical History Past Medical History:  Diagnosis Date  . Aftercare following left knee joint replacement surgery 05/15/2017  . Anesthesia complication    block for left wrist/carpal tunnel did not work; needed 2 doses of Ketamine and Fentanyl to relieve pain  . Anxiety state 04/17/2013  . Arthritis   . Arthropathy 04/17/2013  . At risk for renal compromise 02/12/2017  . Atherosclerotic vascular disease 02/12/2017  . BPH with obstruction/lower urinary tract symptoms 01/14/2022  . Bursitis disorder 04/17/2013  . Coronary artery disease involving native coronary artery of native heart without angina pectoris 07/02/2021   no hx MI; hx stents  . Cramp of limb 04/17/2013  . DDD (degenerative disc disease), cervical   . DDD (degenerative disc disease), lumbar   . DOE (dyspnea on exertion)   . ED (erectile dysfunction) of organic origin 07/30/2016  . Effusion of left knee 10/22/2016  . GERD (gastroesophageal reflux disease)   . GERD (gastroesophageal reflux disease)   . Hearing loss of left ear    no hearing aids  . History of transfusion    during back surgery multiple units  . Hyperlipidemia  09/26/2014  . Kidney stones   .  Kidney stones   . Left flank pain 01/14/2022  . Left-sided low back pain without sciatica 06/03/2016  . Leg cramps 09/23/2017  . Lumbar canal stenosis 09/26/2015  . Lumbar radiculopathy 09/07/2015  . Organic impotence 06/04/2018  . Osteoarthrosis, shoulder region 04/17/2013  . Premature ejaculation 06/04/2018  . Primary localized osteoarthrosis, lower leg 04/17/2013  . Primary osteoarthritis of left knee 10/22/2016  . Rotator cuff tear arthropathy of right shoulder 08/11/2017   Added automatically from request for surgery [phone]  . Sciatica 04/17/2013   left more than right  . Stenosis of cervical spine with myelopathy    (CMD) 09/23/2017  . Stented coronary artery    two DES to LAD 2022  . Stroke determined by clinical assessment    (CMD) 04/06/2020   vision only change  . Tobacco abuse counseling 09/26/2014  . Trochanteric bursitis, right hip 09/07/2015  . Ulcer of toe    (CMD)    left fifth toe  . Wears glasses      Past Surgical History:  Procedure Laterality Date  . BACK SURGERY  11/14/2015   Procedure: BACK SURGERY; LUMBAR LAMINECTOMY  . BICEPS TENODESIS Right 10/07/2017   Procedure: BICEPS TENODESIS;  Surgeon: Vinie Carlin Fonder, MD;  Location: [facility] MAIN OR;  Service: Orthopedics;  Laterality: Right;  . CARPAL TUNNEL RELEASE Right 12/14/2021   Procedure: CARPAL TUNNEL RELEASE;  Surgeon: Vinie Carlin Fonder, MD;  Location: [facility] OUTPATIENT OR;  Service: Orthopedics;  Laterality: Right;  . CARPAL TUNNEL RELEASE Left 04/22/2023   4 corner fusion left wrist Dr Lucillie  . CARPAL TUNNEL RELEASE Left 04/22/2023   RELEASE CARPAL TUNNEL performed by Janas Norleen Lucillie, MD at [facility]  . CERVICAL FUSION  09/25/2015   Procedure: CERVICAL FUSION  . CLAVICLE EXCISION Right 10/07/2017   Procedure: PARTIAL EXCISION CLAVICLE (CPT 23120);  Surgeon: Vinie Carlin Fonder, MD;  Location: [facility] MAIN OR;  Service: Orthopedics;  Laterality: Right;  GENERAL,  C-BLOCK,REVERSE SHLD, DEPUY  . COLONOSCOPY     Procedure: COLONOSCOPY  . CORONARY STENT PLACEMENT  2022   2 stents  . CYSTOSCOPY W/ URETEROSCOPY  08/14/2016   Procedure: CYSTOSCOPY W/ URETEROSCOPY; STONE MANIPULATION AND BILATERAL DJ STENT PLACEMENT  . ELBOW BURSA SURGERY Left    Procedure: ELBOW BURSA SURGERY; WITH BONE SPURS REMOVED  . HERNIA REPAIR     Procedure: HERNIA REPAIR; UMBILICAL HERNIA  . INJECTION ANESTHETIC AGENT TO BRACHIAL PLEXUS N/A 10/08/2017   Procedure: ONE SHOT BLOCK;  Surgeon: Viktoria Pouch, MD;  Location: [facility] MAIN OR;  Service: [facility] Physiatry;  Laterality: N/A;  . JOINT REPLACEMENT Bilateral 04/21/2017   Procedure: JOINT REPLACEMENT; LEFT TOTAL KNEE REPLACEMENT 2018- SPINAL ANESTHESIA  . KIDNEY STONE SURGERY     Procedure: KIDNEY STONE SURGERY  . KNEE SURGERY     Procedure: KNEE SURGERY  . POSTERIOR SPINAL FUSION N/A 12/02/2022   Procedure: T10-Pelvis revision decompression and posterior spinal  fusion with instrumentation and bone graft;  Surgeon: Wallene Norleen Coy, MD;  Location[facility] MAIN OR;  Service: Orthopedics;  Laterality: N/A;  Jackson spine table, [employer] Verse, Conduit, Fibergraft, [facility], C-arm  . REVERSE TOTAL SHOULDER ARTHROPLASTY Right 10/07/2017   Procedure: TOTAL SHOULDER REVERSE ARTHROPLASTY;  Surgeon: Vinie Carlin Fonder, MD;  Location: [facility] MAIN OR;  Service: Orthopedics;  Laterality: Right;  . REVERSE TOTAL SHOULDER ARTHROPLASTY Left 08/20/2023   ARTHROPLASTY SHOULDER TOTAL REVERSED performed by Vinie Carlin Fonder, MD at [facility] OR  . SHOULDER SURGERY     Procedure:  SHOULDER SURGERY  . SKIN LESION EXCISION     Procedure: SKIN LESION EXCISION SCALP  . WRIST FUSION Left 04/22/2023   Left wrist four corner fusion performed by Zhongyu Norleen Cowing, MD at [facility] OR  . WRIST SURGERY Left      Social History Social History   Socioeconomic History  . Marital status: Married    Spouse name: Not on file  . Number of  children: Not on file  . Years of education: Not on file  . Highest education level: Not on file  Occupational History  . Not on file  Tobacco Use  . Smoking status: Former    Current packs/day: 0.00    Average packs/day: 1 pack/day for 50.0 years (50.0 ttl pk-yrs)    Types: Cigarettes    Start date: 46    Quit date: 2023    Years since quitting: 2.6  . Smokeless tobacco: Never  . Tobacco comments:    Per 2024 LS form/chart (1) quit at age 75  Vaping Use  . Vaping status: Never Used  Substance and Sexual Activity  . Alcohol use: No  . Drug use: Not Currently    Types: Oxycodone     Comment: Drug use: Drug Use: Yes; for chronic back pain  . Sexual activity: Not on file  Other Topics Concern  . Not on file  Social History Narrative  . Not on file   Social Drivers of Health   Food Insecurity: Low Risk  (12/04/2022)   Received [facility] visits prior to 01/11/2023.   Food   . Within the past 12 months, you worried that your food would run out before you got money to buy more food: Never true   . Within the past 12 months, the food you bought just didn't last and you didn't have money to get more: Never true  Transportation Needs: No Transportation Needs (12/04/2022)   Received from [facility] visits prior to 01/11/2023.   Transportation   . In the past 12 months, has lack of reliable transportation kept you from medical appointments, meetings, work or from getting things needed for daily living?: No  Safety: Low Risk  (04/01/2024)   Safety   . How often does anyone, including family and friends, physically hurt you?: Never   . How often does anyone, including family and friends, insult or talk down to you?: Never   . How often does anyone, including family and friends, threaten you with harm?: Never   . How often does anyone, including family and friends, scream or curse at you?: Never  Living Situation: Low Risk  (12/04/2022)    Received from [facility] visits prior to 01/11/2023.   Living Needs   . What is your living situation today?: I have a steady place to live   . Think about the place you live.  Do you have problems with any of the following? (Choose all that apply): None of the above    Social History   Tobacco Use  Smoking Status Former  . Current packs/day: 0.00  . Average packs/day: 1 pack/day for 50.0 years (50.0 ttl pk-yrs)  . Types: Cigarettes  . Start date: 66  . Quit date: 2023  . Years since quitting: 2.6  Smokeless Tobacco Never  Tobacco Comments   Per 2024 LS form/chart (1) quit at age 44     FH Family History  Problem Relation Name Age of  Onset  . Alzheimer's disease Mother    . Hearing loss Mother    . Osteoporosis Mother    . Diabetes Father    . Diabetes Sister half sister   . Migraines Daughter    . Cancer Neg Hx    . Heart disease Neg Hx    . Clotting disorder Neg Hx    . Allergic rhinitis Neg Hx    . Hypertension Neg Hx    . Stroke Neg Hx       I reviewed the Problem List, PMH, PSH, SH, and FH with Roy Caldwell today.  Medications Current Outpatient Medications  Medication Sig Dispense Refill  . alfuzosin (UROXATRAL) 10 mg Tb24 24 hour tablet Take one tablet (10 mg total) by mouth daily. 90 tablet 3  . aspirin  81 mg EC tablet Take 81 mg by mouth 2 (two) times a day. 30 tablet 0  . cholecalciferol, vitamin D3, 250 mcg (10,000 unit) tab Take 10,000 Units by mouth Once Daily.    . cilostazoL (PLETAL) 50 mg tablet Take 1 tablet (50 mg total) by mouth 2 (two) times a day. 180 tablet 3  . clopidogreL (PLAVIX) 75 mg tablet TAKE 1 TABLET(75 MG) BY MOUTH DAILY 90 tablet 3  . cyanocobalamin (VITAMIN B12) 1,000 mcg tablet Take 1,000 mcg by mouth Once Daily.    . diclofenac (VOLTAREN) 75 mg EC tablet Take 1 tablet by mouth 2 (two) times a day. To hold one week prior to surgery 08/20/23    . ezetimibe (ZETIA) 10 mg tablet Take 1 tablet by mouth  daily.    . finasteride (PROSCAR) 5 mg tablet Take one tablet (5 mg total) by mouth daily. 90 tablet 3  . nitroglycerin (NITROSTAT) 0.4 mg SL tablet Place 1 tablet (0.4 mg total) under the tongue every 5 (five) minutes as needed for chest pain. 25 tablet 5  . omeprazole (PriLOSEC) 20 mg DR capsule Take 20 mg by mouth Once Daily.    . oxyCODONE  (ROXICODONE ) 10 mg tab Take 10 mg by mouth every 4 (four) hours as needed for moderate pain (4-6). Through pain management    . pravastatin (PRAVACHOL) 40 mg tablet TAKE 1 TABLET(40 MG) BY MOUTH DAILY 90 tablet 3  . sildenafiL (REVATIO) 20 mg tablet TAKE 1 TO 5 TABLETS BY MOUTH 1 HOUR BEFORE SEXUAL ACTIVITY. DO NOT TAKE NITRATES 60 tablet 1   No current facility-administered medications for this visit.    Allergies Allergies  Allergen Reactions  . Shellfish Containing Products Anaphylaxis  . Venom-Honey Bee Anaphylaxis  . Statins-Hmg-Coa Reductase Inhibitors Myalgias    Fatigue, Joint Pain, Leg/Muscle Cramps Atorvastatin; tolerates Pravastatin     Review of Systems Constitutional: no fever, chills, malaise, fatigue.  No weight gain or loss. HEENT:  No headaches, visual changes.  + Hearing problems.  No sore throat. Respiratory: No cough, respiratory distress.  No COPD.  No asthma.  No OSA.  + DOE. Cardiovascular:  See above.  +HTN. Gastrointestinal: No n/v/d, constipation.  + GERD.  No IBS. Genitourinary: + BPH.  + Nephrolithiasis. Skin:  No rashes, bruising. Hematologic/lymphatic: No bleeding, anemia, adenopathy. Musculoskeletal: Back surgery.  See problem list.  + Bursitis.  + DDD.  + Sciatica.  + Osteoarthritis. Neurological: History of stroke. Behavioral/Psych: + Depression, anxiety. Endocrine: No thyroid disease.  Pre-DM.  + HLD. Allergic/Immunologic: No seasonal allergies or immunologic deficits.   PAD as above. Former smoker.   Objective:    Vital Signs BP 116/63 (BP Location:  Right arm, Patient Position: Sitting)   Pulse 97    Wt 98.9 kg (218 lb)   SpO2 96%   BMI 33.15 kg/m  Body mass index is 33.15 kg/m.  Wt Readings from Last 3 Encounters:  07/01/24 98.9 kg (218 lb)  05/17/24 98.3 kg (216 lb 12.8 oz)  04/07/24 95.3 kg (210 lb)    Physical Exam Constitutional: WDWN in NAD. HEENT: PERRL, EOMI.   Neck: Supple without adenopathy.  No JVD.  No thyromegaly.  Carotids exhibit normal upstrokes, no bruits. Respiratory: Respirations unlabored.  CTA bilaterally without rales, rhonchi, or wheezes.   Cardiovascular:  RRR with frequent early systoles, normal S1 and S2, no S3 or S4, no m/r/g. Abdominal: Soft, NT/ND.  Rotund abd. Extremities: FROM.  Radial pulses 2/2 bilaterally.  No LE edema. Neurologic: Motor and sensory not formally tested.  No obvious abnormalities.  Memory, speech, balance appear normal.  Musculoskeletal:  No significant abnormalities.  Walks with a limping unsteady gait with his back and legs straight.   Skin:  No rashes.  No bruising or bleeding.    Lab Review   Lab on 05/17/2024  Component Date Value  . t-Transglutaminase (tTG)* 05/17/2024 <2   . Immunoglobulin A, Quanti* 05/17/2024 366   . Folate, RBC 05/17/2024 >1,374.1 (H)   Office Visit on 03/22/2024  Component Date Value  . Color, Urine 03/22/2024 Dark Yellow (A)   . Clarity, Urine 03/22/2024 Slightly Hazy (A)   . Specific Gravity, Urine 03/22/2024 >=1.030   . pH, Urine 03/22/2024 5.5   . Protein, Urine 03/22/2024 Trace   . Glucose, Urine 03/22/2024 Trace (A)   . Ketones, Urine 03/22/2024 Trace (A)   . Bilirubin, Urine 03/22/2024 1+ (A)   . Blood, Urine 03/22/2024 Negative   . Nitrite, Urine 03/22/2024 Negative   . Leukocyte Esterase, Urine 03/22/2024 Negative   . Urobilinogen, Urine 03/22/2024 1.0   Ancillary Procedure on 03/01/2024  Component Date Value  . Creatinine 03/01/2024 0.9   . eGFR 03/01/2024 >90   . Sample Type, POC 03/01/2024 Venous   . Creatinine 03/01/2024 0.9   . eGFR 03/01/2024 >90   . Sample Type, POC  03/01/2024 Venous   Office Visit on 03/01/2024  Component Date Value  . Iron 03/01/2024 71   . Transferrin 03/01/2024 300   . Ferritin 03/01/2024 22 (L)   . Total Iron Binding Capac* 03/01/2024 429   . Transferrin Saturation 03/01/2024 17   . Sodium 03/01/2024 138   . Potassium 03/01/2024 5.0   . Chloride 03/01/2024 105   . CO2 03/01/2024 28   . Anion Gap 03/01/2024 5 (L)   . Glucose, Random 03/01/2024 89   . Blood Urea Nitrogen (BUN) 03/01/2024 20   . Creatinine 03/01/2024 0.73   . eGFR 03/01/2024 >90   . Albumin 03/01/2024 4.2   . Total Protein 03/01/2024 6.6   . Bilirubin, Total 03/01/2024 0.5   . Alkaline Phosphatase (AL* 03/01/2024 104   . Aspartate Aminotransfera* 03/01/2024 19   . Alanine Aminotransferase* 03/01/2024 18   . Calcium 03/01/2024 9.3   . BUN/Creatinine Ratio 03/01/2024    . TSH 03/01/2024 1.410   . Cholesterol, Total, Lipi* 03/01/2024 155   . Triglycerides, Lipid Pan* 03/01/2024 263 (H)   . HDL Cholesterol - Lipid * 95/78/7974 45 (L)   . LDL Cholesterol, Calcula* 95/78/7974 75   . Non-HDL Cholesterol 03/01/2024 110   . Hemoglobin A1c 03/01/2024 5.6   . Estimated Average Glucose 03/01/2024 114   . Vitamin  B-12 03/01/2024 616   . PSA, Total 03/01/2024 2.10   . B-Type Natriuretic Pepti* 03/01/2024 122 (H)   . Magnesium  03/01/2024 2.0   . Color, Urine 03/01/2024 Yellow   . Clarity, Urine 03/01/2024 Clear   . Specific Gravity, Urine 03/01/2024 1.018   . pH, Urine 03/01/2024 5.5   . Protein, Urine 03/01/2024 Negative   . Glucose, Urine 03/01/2024 Negative   . Ketones, Urine 03/01/2024 Negative   . Bilirubin, Urine 03/01/2024 Negative   . Blood, Urine 03/01/2024 Negative   . Nitrite, Urine 03/01/2024 Negative   . Leukocyte Esterase, Urine 03/01/2024 Negative   . Urobilinogen, Urine 03/01/2024 Normal   . WBC 03/01/2024 8.10   . RBC 03/01/2024 3.98 (L)   . Hemoglobin 03/01/2024 12.7 (L)   . Hematocrit 03/01/2024 38.9 (L)   . Mean Corpuscular Volume *  03/01/2024 97.7 (H)   . Mean Corpuscular Hemoglo* 03/01/2024 31.8   . Mean Corpuscular Hemoglo* 03/01/2024 32.6 (L)   . Red Cell Distribution Wi* 03/01/2024 12.8   . Platelet Count (PLT) 03/01/2024 240   . Mean Platelet Volume (MP* 03/01/2024 9.9   . Neutrophils % 03/01/2024 57   . Lymphocytes % 03/01/2024 25   . Monocytes % 03/01/2024 13   . Eosinophils % 03/01/2024 5   . Basophils % 03/01/2024 0   . nRBC % 03/01/2024 0   . Neutrophils Absolute 03/01/2024 4.60   . Lymphocytes # 03/01/2024 2.00   . Monocytes # 03/01/2024 1.00 (H)   . Eosinophils # 03/01/2024 0.40   . Basophils # 03/01/2024 0.00   . nRBC Absolute 03/01/2024 0.00     ECGs: 05/08/2023: Sinus bradycardia 58 bpm, no acute changes.  Echo 06/28/2021: Normal LV size, inferior wall HK, systolic function low normal, EF 50-55%.  Prolonged relaxation.  RV/RA normal.  Mild LAE.  No PFO by Doppler.  AV3L.  No AS/AI.  Trace MR.  No TR.  Unable to determine RVSP.  No PI.  Aortic sinus, ascending aorta, IVC, RAP normal.  No pericardial or pleural effusion.  4-day Zio patch monitor 5/14 - 03/27/2024 showed 6% PACs and I recommended metoprolol tartrate 12.5 BID.  Lexiscan 03/24/2024 showed small apical/apical septal infarct, no ischemia, normal LV wall motion, EF 76%.   Cath/PCI 05/25/2021 by Dr. Launie. Indication  angina, DOE, abnormal perfusion study  Access  RRA  Comp  none  Status  elective  IMP   Double vessel CAD   Severe prox LAD stenosis;  CTO of RCA with  inferobasal akinesis.   PCI   DES LAD, successful. Sp1 pinched however maintained good flow by end  of procedure   PLAN  DAPT x 6-12 months.    Note   Patient has clinically had an IMI recently per cath data, EKG, and  perfusion study which I interpret as mainly fixed nonreversible inferior  defect.  PCI of RCA accordingly not required     The ASCVD Risk score (Arnett DK, et al., 2019) failed to calculate for the following reasons:   Risk score cannot be  calculated because patient has a medical history suggesting prior/existing ASCVD  Assessment and Plan:   1. Coronary artery disease involving native coronary artery of native heart without angina pectoris      2. History of coronary artery stent placement      3. Long-term use of aspirin  therapy      4. Mixed hyperlipidemia      5. Atherosclerosis of native artery of both lower extremities  with intermittent claudication          ASSESSMENT/PLAN: Claudication?  - Hx of PAD.  Saw Leita Tasted, PA-C in Vascular Surgery and was restarted on Pletal.  Continue aspirin , clopidogrel, ezetimibe.  She restarted pravastatin and we are continuing that and ezetimibe.  I do not think he needs Repatha.     CAD with DES to LAD, RCA CTO by cath by Dr. Launie on 05/25/2021.  Has had a couple episodes of CP.  He is sedentary d/t back pain and mobility issues.  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  Continue aspirin , clopidogrel, pravastatin, ezetimibe.   BP 116/63 today and was 104/62 at last visit.  No meds. HLD.  FLP 03/01/2024: TC 155, TG 263, HDL 45, LDL 75.  On pravastatin 40 and ezetimibe 10.  LDL goal  <70.   He does have an extensive history of vascular disease with CAD, PAD, and history of a stroke.    Atorvastatin caused myalgias in the past.  Tolerating pravastatin 40 and on ezetimibe 10, has LDL 75. COPD - per PCP.  Refer to Pulmonary as needed.   GERD, abdominal bloating, indigestion - probably needs GI w/u.  Defer to PCP. Hx CVA - with blurred vision in R eye, resolved.  Continue aspirin , clopidogrel, ezetimibe.  Statin as above. Monitor showed 6% PACs.  Asymptomatic.  Did not start metoprolol. Our Practice Advisories brought to you by EPIC: Advance Care Plan - defer to PCP Blood thinners -- yes aspirin , clopidogrel, pletal.  No bleeding problems.  Has extensive vascular disease. F/u  in 6  mo.   Return in about 6 months (around 01/01/2025).  Darice Earnie Sharps, MD, FACC,  FSCAI   Cc: No ref. provider found, Franky Ozell Molt, PA-C   Pre-visit charting:  22 min during lunch break on day of visit Visit + post-visit charting, orders, Rx, wrap-up, and AVS:  ~ I spent >50% of the time evaluating and counseling the patient and discussing the diagnosis, work-up, general prognosis, and plan of care.

## 2024-07-01 NOTE — Progress Notes (Signed)
 COVID Vaccine received:  [x]  No []  Yes Date of any COVID positive Test in last 90 days:  none  PCP -  Franky EMERSON Molt PA-C [facility] [phone] (Work)  [phone] (Fax)  Cardiologist - Darice Earnie Sharps, MD at [facility]. Clearance in 07-01-24 note [phone] (Work)    [phone] (Fax)      Chest x-ray -  EKG - 04-2023 [facility]   will repeat   Stress Test - 03-24-2024  CE  ECHO - 06-28-2021  CE Cardiac Cath - 05-25-2021 [facility] / DES x 2 by Dr. Wadie Counter at [facility]  CT Coronary Calcium score:  ZIO monitor- 02-23-24  CE   Pacemaker / ICD device [x]  No []  Yes   Spinal Cord Stimulator:[x]  No []  Yes       History of Sleep Apnea? [x]  No []  Yes   CPAP used?- [x]  No []  Yes    Patient has: [x]  NO Hx DM   []  Pre-DM   []  DM1  []   DM2 Does the patient monitor blood sugar?   [x]  N/A   []  No []  Yes  Last A1c was: 5.6 normal on 03-01-24      Blood Thinner / Instructions: per Patient:  Plavix:  Hold 120 hours    Last: 07-01-24 Pletal:  Hold 120 hours   Last: 07-01-24 Aspirin  Instructions: ASA 81 mg    Hold 1 week, Last dose: 06-30-24  ERAS Protocol Ordered: [x]  No  []  Yes Patient is to be NPO after: MN Prior  Dental hx: []  Dentures:  []  N/A      [x]  Bridge or Partial: on bottom                  []  Loose or Damaged teeth:   Comments: Since this is a revision surgery, the patient was given Benzoyl peroxide Gel as ordered. Instruction regarding application starting 2 days prior to surgery was given and patient voiced understanding.   Patient was given the 5 CHG shower / bath instructions for Reverse Shoulder arthroplasty surgery along with 2 bottles of the CHG soap. Patient will start this on:    07-03-24          Activity level: Able to walk up 2 flights of stairs without becoming significantly short of breath or having chest pain?  [x]  No   []    Yes  Patient can perform ADLs without assistance. []  No   [x]   Yes  Anesthesia review: GERD, PVD, CAD - DES x 2 (Dr. Counter 2022), anemia, Hx  CVA, Smoker: quit 2022,  CPS- failed back syn. ACDF (C3-5)   2016 at [facility]   Patient denies any S&S of respiratory illness or Covid - no shortness of breath, fever, cough or chest pain at PAT appointment.  Patient verbalized understanding and agreement to the Pre-Surgical Instructions that were given to them at this PAT appointment. Patient was also educated of the need to review these PAT instructions again prior to his surgery.I reviewed the appropriate phone numbers to call if they have any and questions or concerns.

## 2024-07-01 NOTE — Patient Instructions (Addendum)
 SURGICAL WAITING ROOM VISITATION Patients having surgery or a procedure may have no more than 2 support people in the waiting area - these visitors may rotate in the visitor waiting room.   If the patient needs to stay at the hospital during part of their recovery, the visitor guidelines for inpatient rooms apply.  PRE-OP VISITATION  Pre-op nurse will coordinate an appropriate time for 1 support person to accompany the patient in pre-op.  This support person may not rotate.  This visitor will be contacted when the time is appropriate for the visitor to come back in the pre-op area.  Please refer to the [facility] website for the visitor guidelines for Inpatients (after your surgery is over and you are in a regular room).  You are not required to quarantine at this time prior to your surgery. However, you must do this: Hand Hygiene often Do NOT share personal items Notify your provider if you are in close contact with someone who has COVID or you develop fever 100.4 or greater, new onset of sneezing, cough, sore throat, shortness of breath or body aches.  If you test positive for Covid or have been in contact with anyone that has tested positive in the last 10 days please notify you surgeon.    Your procedure is scheduled on:  Wednesday  July 07, 2024  Report to [facility] Main Entrance: [address] entrance where the [employer] is available.   Report to admitting at:  10:00   AM  Call this number if you have any questions or problems the morning of surgery [phone]  DO NOT EAT OR DRINK ANYTHING AFTER MIDNIGHT THE NIGHT PRIOR TO YOUR SURGERY / PROCEDURE.   FOLLOW  ANY ADDITIONAL PRE OP INSTRUCTIONS YOU RECEIVED FROM YOUR SURGEON'S OFFICE!!!   Oral Hygiene is also important to reduce your risk of infection.        Remember - BRUSH YOUR TEETH THE MORNING OF SURGERY WITH YOUR REGULAR TOOTHPASTE  Do NOT smoke after Midnight the night before surgery.  STOP TAKING all  Vitamins, Herbs and supplements 1 week before your surgery.   Take ONLY these medicines the morning of surgery with A SIP OF WATER: Finasteride, Alfuzosin (Uroxatral) and Omeprazole if needed.   IYou may not have any metal on your body including  jewelry, and body piercing  Do not wear  lotions, powders, cologne, or deodorant  Men may shave face and neck.  Contacts, Hearing Aids, dentures or bridgework may not be worn into surgery. DENTURES WILL BE REMOVED PRIOR TO SURGERY PLEASE DO NOT APPLY Poly grip OR ADHESIVES!!!  You may bring a small overnight bag with you on the day of surgery, only pack items that are not valuable. [facility] IS NOT RESPONSIBLE   FOR VALUABLES THAT ARE LOST OR STOLEN.   Do not bring your home medications to the hospital. The Pharmacy will dispense medications listed on your medication list to you during your admission in the Hospital.   Please read over the following fact sheets you were given: IF YOU HAVE QUESTIONS ABOUT YOUR PRE-OP INSTRUCTIONS, PLEASE CALL [phone].      Pre-operative 5 CHG Bath Instructions   You can play a key role in reducing the risk of infection after surgery. Your skin needs to be as free of germs as possible. You can reduce the number of germs on your skin by washing with CHG (chlorhexidine gluconate) soap before surgery. CHG is an antiseptic soap that kills germs  and continues to kill germs even after washing.   DO NOT use if you have an allergy to chlorhexidine/CHG or antibacterial soaps. If your skin becomes reddened or irritated, stop using the CHG and notify one of our RNs at [phone]  Please shower with the CHG soap starting 4 days before surgery using the following schedule: START SHOWERS ON   SATURDAY  July 03, 2024                                                                                                                                                                           Please keep in mind the  following:  DO NOT shave, including legs and underarms, starting the day of your first shower.   You may shave your face at any point before/day of surgery.   Place clean sheets on your bed the day you start using CHG soap. Use a clean washcloth (not used since being washed) for each shower. DO NOT sleep with pets once you start using the CHG.   CHG Shower Instructions:  If you choose to wash your hair and private area, wash first with your normal shampoo/soap.  After you use shampoo/soap, rinse your hair and body thoroughly to remove shampoo/soap residue.  Turn the water OFF and apply about 3 tablespoons (45 ml) of CHG soap to a CLEAN washcloth.  Apply CHG soap ONLY FROM YOUR NECK DOWN TO YOUR TOES (washing for 3-5 minutes)  DO NOT use CHG soap on face, private areas, open wounds, or sores.  Pay special attention to the area where your surgery is being performed.  If you are having back surgery, having someone wash your back for you may be helpful.  Wait 2 minutes after CHG soap is applied, then you may rinse off the CHG soap.  Pat dry with a clean towel  Put on clean clothes/pajamas   If you choose to wear lotion, please use ONLY the CHG-compatible lotions on the back of this paper.     Additional instructions for the day of surgery: DO NOT APPLY any lotions, deodorants, cologne, or perfumes.   Put on clean/comfortable clothes.  Brush your teeth.  Ask your nurse before applying any prescription medications to the skin.      CHG Compatible Lotions   Aveeno Moisturizing lotion  Cetaphil Moisturizing Cream  Cetaphil Moisturizing Lotion  Clairol Herbal Essence Moisturizing Lotion, Dry Skin  Clairol Herbal Essence Moisturizing Lotion, Extra Dry Skin  Clairol Herbal Essence Moisturizing Lotion, Normal Skin  Curel Age Defying Therapeutic Moisturizing Lotion with Alpha Hydroxy  Curel Extreme Care Body Lotion  Curel Soothing Hands Moisturizing Hand Lotion  Curel Therapeutic  Moisturizing Cream, Fragrance-Free  Curel Therapeutic Moisturizing Lotion, Fragrance-Free  Curel Therapeutic Moisturizing Lotion, Original Formula  Eucerin Daily Replenishing Lotion  Eucerin Dry Skin Therapy Plus Alpha Hydroxy Crme  Eucerin Dry Skin Therapy Plus Alpha Hydroxy Lotion  Eucerin Original Crme  Eucerin Original Lotion  Eucerin Plus Crme Eucerin Plus Lotion  Eucerin TriLipid Replenishing Lotion  Keri Anti-Bacterial Hand Lotion  Keri Deep Conditioning Original Lotion Dry Skin Formula Softly Scented  Keri Deep Conditioning Original Lotion, Fragrance Free Sensitive Skin Formula  Keri Lotion Fast Absorbing Fragrance Free Sensitive Skin Formula  Keri Lotion Fast Absorbing Softly Scented Dry Skin Formula  Keri Original Lotion  Keri Skin Renewal Lotion Keri Silky Smooth Lotion  Keri Silky Smooth Sensitive Skin Lotion  Nivea Body Creamy Conditioning Oil  Nivea Body Extra Enriched Lotion  Nivea Body Original Lotion  Nivea Body Sheer Moisturizing Lotion Nivea Crme  Nivea Skin Firming Lotion  NutraDerm 30 Skin Lotion  NutraDerm Skin Lotion  NutraDerm Therapeutic Skin Cream  NutraDerm Therapeutic Skin Lotion  ProShield Protective Hand Cream  Provon moisturizing lotion  FAILURE TO FOLLOW THESE INSTRUCTIONS MAY RESULT IN THE CANCELLATION OF YOUR SURGERY  PATIENT SIGNATURE_________________________________  NURSE SIGNATURE__________________________________  ________________________________________________________________________         Roy Caldwell    An incentive spirometer is a tool that can help keep your lungs clear and active. This tool measures how well you are filling your lungs with each breath. Taking long deep breaths may help reverse or decrease the chance of developing breathing (pulmonary) problems (especially infection) following: A long period of time when you are unable to move or be active. BEFORE THE PROCEDURE  If the spirometer includes an  indicator to show your best effort, your nurse or respiratory therapist will set it to a desired goal. If possible, sit up straight or lean slightly forward. Try not to slouch. Hold the incentive spirometer in an upright position. INSTRUCTIONS FOR USE  Sit on the edge of your bed if possible, or sit up as far as you can in bed or on a chair. Hold the incentive spirometer in an upright position. Breathe out normally. Place the mouthpiece in your mouth and seal your lips tightly around it. Breathe in slowly and as deeply as possible, raising the piston or the ball toward the top of the column. Hold your breath for 3-5 seconds or for as long as possible. Allow the piston or ball to fall to the bottom of the column. Remove the mouthpiece from your mouth and breathe out normally. Rest for a few seconds and repeat Steps 1 through 7 at least 10 times every 1-2 hours when you are awake. Take your time and take a few normal breaths between deep breaths. The spirometer may include an indicator to show your best effort. Use the indicator as a goal to work toward during each repetition. After each set of 10 deep breaths, practice coughing to be sure your lungs are clear. If you have an incision (the cut made at the time of surgery), support your incision when coughing by placing a pillow or rolled up towels firmly against it. Once you are able to get out of bed, walk around indoors and cough well. You may stop using the incentive spirometer when instructed by your caregiver.  RISKS AND COMPLICATIONS Take your time so you do not get dizzy or light-headed. If you are in pain, you may need to take or ask for pain medication before doing incentive spirometry. It is harder to take a deep breath if you are having  pain. AFTER USE Rest and breathe slowly and easily. It can be helpful to keep track of a log of your progress. Your caregiver can provide you with a simple table to help with this. If you are using the  spirometer at home, follow these instructions: SEEK MEDICAL CARE IF:  You are having difficultly using the spirometer. You have trouble using the spirometer as often as instructed. Your pain medication is not giving enough relief while using the spirometer. You develop fever of 100.5 F (38.1 C) or higher.                                                                                                    SEEK IMMEDIATE MEDICAL CARE IF:  You cough up bloody sputum that had not been present before. You develop fever of 102 F (38.9 C) or greater. You develop worsening pain at or near the incision site. MAKE SURE YOU:  Understand these instructions. Will watch your condition. Will get help right away if you are not doing well or get worse. Document Released: 03/10/2007 Document Revised: 01/20/2012 Document Reviewed: 05/11/2007 [facility] Patient Information 2014 [place], [place].       If you would like to see a video about joint replacement:   [email or web address]

## 2024-07-02 ENCOUNTER — Encounter (HOSPITAL_COMMUNITY): Payer: Self-pay

## 2024-07-02 ENCOUNTER — Encounter (HOSPITAL_COMMUNITY)
Admission: RE | Admit: 2024-07-02 | Discharge: 2024-07-02 | Disposition: A | Discharge status: Home / Self Care | Source: Ambulatory Visit | Attending: Orthopaedic Surgery | Admitting: Orthopaedic Surgery

## 2024-07-02 ENCOUNTER — Other Ambulatory Visit: Payer: Self-pay

## 2024-07-02 VITALS — BP 108/54 | HR 80 | Temp 98.2°F | Resp 20 | Ht 67.0 in | Wt 213.0 lb

## 2024-07-02 DIAGNOSIS — Z7902 Long term (current) use of antithrombotics/antiplatelets: Secondary | ICD-10-CM | POA: Insufficient documentation

## 2024-07-02 DIAGNOSIS — I739 Peripheral vascular disease, unspecified: Secondary | ICD-10-CM | POA: Insufficient documentation

## 2024-07-02 DIAGNOSIS — Z87891 Personal history of nicotine dependence: Secondary | ICD-10-CM | POA: Diagnosis not present

## 2024-07-02 DIAGNOSIS — Z981 Arthrodesis status: Secondary | ICD-10-CM | POA: Insufficient documentation

## 2024-07-02 DIAGNOSIS — S42112A Displaced fracture of body of scapula, left shoulder, initial encounter for closed fracture: Secondary | ICD-10-CM | POA: Insufficient documentation

## 2024-07-02 DIAGNOSIS — Z955 Presence of coronary angioplasty implant and graft: Secondary | ICD-10-CM | POA: Insufficient documentation

## 2024-07-02 DIAGNOSIS — Z96612 Presence of left artificial shoulder joint: Secondary | ICD-10-CM | POA: Insufficient documentation

## 2024-07-02 DIAGNOSIS — Z01818 Encounter for other preprocedural examination: Secondary | ICD-10-CM | POA: Insufficient documentation

## 2024-07-02 DIAGNOSIS — I2510 Atherosclerotic heart disease of native coronary artery without angina pectoris: Secondary | ICD-10-CM | POA: Diagnosis not present

## 2024-07-02 DIAGNOSIS — X58XXXA Exposure to other specified factors, initial encounter: Secondary | ICD-10-CM | POA: Diagnosis not present

## 2024-07-02 DIAGNOSIS — I129 Hypertensive chronic kidney disease with stage 1 through stage 4 chronic kidney disease, or unspecified chronic kidney disease: Secondary | ICD-10-CM | POA: Diagnosis not present

## 2024-07-02 DIAGNOSIS — I723 Aneurysm of iliac artery: Secondary | ICD-10-CM | POA: Diagnosis not present

## 2024-07-02 DIAGNOSIS — N189 Chronic kidney disease, unspecified: Secondary | ICD-10-CM | POA: Diagnosis not present

## 2024-07-02 DIAGNOSIS — Z79891 Long term (current) use of opiate analgesic: Secondary | ICD-10-CM | POA: Insufficient documentation

## 2024-07-02 DIAGNOSIS — Z8673 Personal history of transient ischemic attack (TIA), and cerebral infarction without residual deficits: Secondary | ICD-10-CM | POA: Diagnosis not present

## 2024-07-02 DIAGNOSIS — T84112A Breakdown (mechanical) of internal fixation device of bone of right forearm, initial encounter: Secondary | ICD-10-CM | POA: Diagnosis not present

## 2024-07-02 HISTORY — DX: Essential (primary) hypertension: I10

## 2024-07-02 HISTORY — DX: Myoneural disorder, unspecified: G70.9

## 2024-07-02 HISTORY — DX: Personal history of urinary calculi: Z87.442

## 2024-07-02 HISTORY — DX: Anemia, unspecified: D64.9

## 2024-07-02 HISTORY — DX: Pneumonia, unspecified organism: J18.9

## 2024-07-02 HISTORY — DX: Malignant (primary) neoplasm, unspecified: C80.1

## 2024-07-02 HISTORY — DX: Peripheral vascular disease, unspecified: I73.9

## 2024-07-02 HISTORY — DX: Unspecified osteoarthritis, unspecified site: M19.90

## 2024-07-02 HISTORY — DX: Chronic kidney disease, unspecified: N18.9

## 2024-07-02 HISTORY — DX: Gastro-esophageal reflux disease without esophagitis: K21.9

## 2024-07-02 LAB — COMPREHENSIVE METABOLIC PANEL WITH GFR
ALT: 20 U/L (ref 0–44)
AST: 25 U/L (ref 15–41)
Albumin: 3.6 g/dL (ref 3.5–5.0)
Alkaline Phosphatase: 106 U/L (ref 38–126)
Anion gap: 10 (ref 5–15)
BUN: 20 mg/dL (ref 8–23)
CO2: 21 mmol/L — ABNORMAL LOW (ref 22–32)
Calcium: 8.9 mg/dL (ref 8.9–10.3)
Chloride: 108 mmol/L (ref 98–111)
Creatinine, Ser: 0.86 mg/dL (ref 0.61–1.24)
GFR, Estimated: >60 mL/min (ref >60)
Glucose, Bld: 138 mg/dL — ABNORMAL HIGH (ref 70–99)
Potassium: 4.1 mmol/L (ref 3.5–5.1)
Sodium: 139 mmol/L (ref 135–145)
Total Bilirubin: 0.9 mg/dL (ref 0.0–1.2)
Total Protein: 7.2 g/dL (ref 6.5–8.1)

## 2024-07-02 LAB — CBC
HCT: 39.8 % (ref 39.0–52.0)
Hemoglobin: 12.7 g/dL — ABNORMAL LOW (ref 13.0–17.0)
MCH: 31.4 pg (ref 26.0–34.0)
MCHC: 31.9 g/dL (ref 30.0–36.0)
MCV: 98.5 fL (ref 80.0–100.0)
Platelets: 251 K/uL (ref 150–400)
RBC: 4.04 MIL/uL — ABNORMAL LOW (ref 4.22–5.81)
RDW: 12.3 % (ref 11.5–15.5)
WBC: 7.3 K/uL (ref 4.0–10.5)
nRBC: 0 % (ref 0.0–0.2)

## 2024-07-02 LAB — SURGICAL PCR SCREEN
MRSA, PCR: NEGATIVE
Staphylococcus aureus: POSITIVE — AB

## 2024-07-02 NOTE — Telephone Encounter (Signed)
 Beverley Millman calling still needs medical clearance form in chart completed and faxed back ASAP. Needs to know when to stop Plavix, tomorrow will be 5 days prior to surgery.  Cb# [phone] [place])

## 2024-07-05 NOTE — Progress Notes (Signed)
 Patient's PCR screen is positive for STAPH. Appropriate notes have been placed on the patient's chart. This note has been routed to Dr. Cristy and Aleck Stalling, PA-C for review. The Patient's surgery is currently scheduled for: 07-07-2024 at [facility].  Shawnee Aloe, BSN, CVRN-BC   Pre-Surgical Testing Nurse [facility]  [phone]

## 2024-07-05 NOTE — Progress Notes (Signed)
 Anesthesia Chart Review   Case: [id number] Date/Time: 07/07/24 1315   Procedures:      REVISION, REVERSE TOTAL ARTHROPLASTY, SHOULDER (Left: Shoulder)     OPEN REDUCTION INTERNAL FIXATION (ORIF) SHOULDER FRACTURE (Left: Shoulder)   Anesthesia type: General   Diagnosis:      Complications due to internal orthopedic device, implant, and graft (HCC) [U15.9XXA]     Closed displaced fracture of body of left scapula, initial encounter [S42.112A]   Pre-op diagnosis: displaced fracture of body of left scapula, Complications due to internal orthopedic device, implant, and graft   Location: [facility] ROOM 09 / [facility]   Surgeons: Cristy Bonner DASEN, MD       DISCUSSION:68 y.o. former smoker with h/o COPD, HTN, CAD s/p DES LAD, RCA 05/25/2021, CKD, PVD aneurysm of L iliac art, R SFA procedure 2019, TIA 2021, complications of left shoulder revision implant, fracture of left scapula scheduled for above procedure 07/07/24 with Dr. Bonner Cristy.   Pt with h/o ACDF C3-5.   H/o lumbar fusion T10-sacrum.   Pt last seen by cardiology 07/01/2024. Stable at this visit. Lexiscan 03/24/2024 showed small apical/apical septal infarct, no ischemia, normal LV wall motion, EF 76%.  Advised to follow up in 6 months.   Echo 06/28/2021: Normal LV size, inferior wall HK, systolic function low normal, EF 50-55%. Prolonged relaxation. RV/RA normal. Mild LAE. No PFO by Doppler. AV3L. No AS/AI. Trace MR. No TR. Unable to determine RVSP. No PI. Aortic sinus, ascending aorta, IVC, RAP normal. No pericardial or pleural effusion.   Pt last seen by vascular surgery 04/07/2024. 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 Pletal restarted at this visit.   Pt reports last dose of Plavix 07/01/24, last dose of Pletal 07/01/24.  VS: BP (!) 108/54 Comment: right arm sitting  Pulse 80   Temp 36.8 C (Oral)   Resp 20   Ht 5' 7 (1.702 m)   Wt 96.6 kg   SpO2 97%   BMI 33.36 kg/m   PROVIDERS: Joshua Franky Sharper, PA-C is PCP  Claudene Darice Jansky, MD is Cardiologist with [facility]  LABS: Labs reviewed: Acceptable for surgery. (all labs ordered are listed, but only abnormal results are displayed)  Labs Reviewed  SURGICAL PCR SCREEN - Abnormal; Notable for the following components:      Result Value   Staphylococcus aureus POSITIVE (*)    All other components within normal limits  COMPREHENSIVE METABOLIC PANEL WITH GFR - Abnormal; Notable for the following components:   CO2 21 (*)    Glucose, Bld 138 (*)    All other components within normal limits  CBC - Abnormal; Notable for the following components:   RBC 4.04 (*)    Hemoglobin 12.7 (*)    All other components within normal limits  TYPE AND SCREEN     IMAGES: US  Aorta Iliac Vessels Doppler Complete 04/07/2024 (Care Everywhere) Conclusion  No evidence of significant dilatation of the abdominal aorta is noted. 2.9 x 2.9 cm right iliac artery aneurysm. 2.0 x 2.6 left iliac artery aneurysm. No evidence of peripheral arterial aneurysms.   EKG:   CV: Stress Test 03/24/2024 (Care Everywhere) 1.  Small apical/apicoseptal infarct.  No ischemia..   2. Normal left ventricular wall motion.   3. Left ventricular ejection fraction 76%   4. 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 Wadie Counter   EYE SURGERY     FB removal   HERNIA REPAIR     ;umbilical   JOINT REPLACEMENT  Right 2015   total shoulder [facility] Dr[facility] Lewanda [facility]   REPLACEMENT TOTAL KNEE Bilateral    right  2015   left  2023   TOTAL SHOULDER REPLACEMENT Left 2023   done at [facility]  by Dr. Lewanda    MEDICATIONS:  alfuzosin  (UROXATRAL ) 10 MG 24 hr tablet   aspirin  EC 81 MG tablet   cholecalciferol (VITAMIN D3) 25 MCG (1000 UNIT) tablet   cilostazol (PLETAL) 50 MG tablet   clopidogrel (PLAVIX) 75 MG tablet   cyanocobalamin (VITAMIN B12) 500 MCG tablet   diclofenac  (VOLTAREN ) 75 MG EC tablet   ezetimibe  (ZETIA ) 10 MG tablet   finasteride  (PROSCAR ) 5 MG tablet   HYDROcodone -acetaminophen  (NORCO/VICODIN) 5-325 MG tablet   Multiple Vitamin (MULTIVITAMIN WITH MINERALS) TABS tablet   nitroGLYCERIN (NITROSTAT) 0.4 MG SL tablet   omeprazole (PRILOSEC) 20 MG capsule   pravastatin  (PRAVACHOL ) 40 MG tablet   No current facility-administered medications for this encounter.    Harlene Hoots Ward, PA-C [facility] Pre-Surgical Testing [phone]

## 2024-07-06 NOTE — Anesthesia Preprocedure Evaluation (Signed)
 Anesthesia Evaluation  Patient identified by MRN, date of birth, ID band Patient awake    Reviewed: Allergy & Precautions, NPO status , Patient's Chart, lab work & pertinent test results  History of Anesthesia Complications Negative for: history of anesthetic complications  Airway Mallampati: II  TM Distance: >3 FB Neck ROM: Full    Dental  (+) Dental Advisory Given   Pulmonary former smoker   Pulmonary exam normal        Cardiovascular hypertension, + CAD and + Peripheral Vascular Disease  Normal cardiovascular exam   '25 Nuc Scan - 1.  Small apical/apicoseptal infarct.  No ischemia..  2. Normal left ventricular wall motion.  3. Left ventricular ejection fraction 76%  4. Non invasive risk stratification*: Low     Neuro/Psych TIA Neuromuscular disease  negative psych ROS   GI/Hepatic Neg liver ROS,GERD  Medicated and Controlled,,  Endo/Other   Obesity   Renal/GU CRFRenal disease     Musculoskeletal  (+) Arthritis ,    Abdominal   Peds  Hematology  On plavix    Anesthesia Other Findings   Reproductive/Obstetrics                              Anesthesia Physical Anesthesia Plan  ASA: 3  Anesthesia Plan: General   Post-op Pain Management: Regional block* and Tylenol  PO (pre-op)*   Induction: Intravenous  PONV Risk Score and Plan: 2 and Treatment may vary due to age or medical condition, Ondansetron  and Dexamethasone   Airway Management Planned: Oral ETT  Additional Equipment: None  Intra-op Plan:   Post-operative Plan: Extubation in OR  Informed Consent: I have reviewed the patients History and Physical, chart, labs and discussed the procedure including the risks, benefits and alternatives for the proposed anesthesia with the patient or authorized representative who has indicated his/her understanding and acceptance.     Dental advisory given  Plan Discussed with:  CRNA and Anesthesiologist  Anesthesia Plan Comments: (See PAT note 07/02/24)         Anesthesia Quick Evaluation

## 2024-07-06 NOTE — H&P (Signed)
 PREOPERATIVE H&P  Chief Complaint: displaced fracture of body of left scapula, Complications due to internal orthopedic device, implant, and graft  HPI: Roy Caldwell is a 68 y.o. male who presents for preoperative history and physical prior to scheduled surgery, Procedure(s): REVISION, REVERSE TOTAL ARTHROPLASTY, SHOULDER OPEN REDUCTION INTERNAL FIXATION (ORIF) SHOULDER FRACTURE.   Patient has a past medical history significant for COPD, HTN, CAD s/p DES LAD, RCA 05/25/2021, CKD, PVD aneurysm of L iliac art, R SFA procedure 2019, TIA 2021.   Patient has had trouble with his left shoulder for quite some time. He had a left shoulder replacement last year by an outside provider. Pain began to worsen and he feels like his shoulder is unstable.   Symptoms are rated as moderate to severe, and have been worsening.  This is significantly impairing activities of daily living.    Please see clinic note for further details on this patient's care.    He has elected for surgical management.   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 Wadie Counter   EYE SURGERY     FB removal   HERNIA REPAIR     ;umbilical   JOINT REPLACEMENT Right 2015   total [facility] Lewanda [facility]   REPLACEMENT TOTAL KNEE Bilateral    right  2015   left  2023   TOTAL SHOULDER REPLACEMENT Left 2023   done at [facility]  by Dr. Lewanda   Social History   Socioeconomic History   Marital status: Married     Spouse name: Not on file   Number of children: Not on file   Years of education: Not on file   Highest education level: Not on file  Occupational History   Not on file  Tobacco Use   Smoking status: Former    Types: Cigarettes    Start date: 2023    Quit date: 1973    Years since quitting: 52.6   Smokeless tobacco: Never  Vaping Use   Vaping status: Never Used  Substance and Sexual Activity   Alcohol use: Not Currently   Drug use: Not Currently   Sexual activity: Not Currently  Other Topics Concern   Not on file  Social History Narrative   Not on file   Social Drivers of Health   Financial Resource Strain: Not on file  Food Insecurity: Low Risk  (12/04/2022)   Received from [facility]   Hunger Vital Sign    Within the past 12 months, you worried that your food would run out before you got money to buy more: Never true    Within the past 12 months, the food you bought just didn't last and you didn't have money to get more: Not on file  Transportation Needs: Not on file (12/04/2022)  Physical  Activity: Not on file  Stress: Not on file  Social Connections: Not on file   No family history on file. Allergies  Allergen Reactions   Bee Venom Anaphylaxis and Hives   Shellfish Allergy Anaphylaxis, Hives and Dermatitis    Pt can tolerate shrimp   Statins    Prior to Admission medications   Medication Sig Start Date End Date Taking? Authorizing Provider  alfuzosin  (UROXATRAL ) 10 MG 24 hr tablet Take 10 mg by mouth daily.   Yes [provider]  aspirin  EC 81 MG tablet Take 81 mg by mouth daily. Swallow whole.   Yes [provider]  cholecalciferol (VITAMIN D3) 25 MCG (1000 UNIT) tablet Take 1,000 Units by mouth daily.   Yes [provider]  cilostazol (PLETAL) 50 MG tablet Take 50 mg by mouth 2 (two) times daily. 04/07/24 04/07/25 Yes [provider]  clopidogrel (PLAVIX) 75 MG tablet Take 75 mg by mouth daily.   Yes [provider]  cyanocobalamin (VITAMIN B12) 500 MCG tablet Take 500 mcg by mouth daily.   Yes [provider]  diclofenac  (VOLTAREN ) 75 MG EC tablet Take 75 mg by mouth 2 (two) times daily. 02/17/23  Yes [provider]  ezetimibe  (ZETIA ) 10 MG tablet Take 10 mg by mouth daily.   Yes [provider]  finasteride  (PROSCAR ) 5 MG tablet Take 5 mg by mouth daily.   Yes [provider]  Multiple Vitamin (MULTIVITAMIN WITH MINERALS) TABS tablet Take 1 tablet by mouth daily.   Yes [provider]  omeprazole (PRILOSEC) 20 MG capsule Take 20-40 mg by mouth See admin instructions. Take 20 mg by mouth every other day, alternating with 40 mg on alternate days 09/17/17  Yes [provider]  pravastatin  (PRAVACHOL ) 40 MG tablet Take 40 mg by mouth daily.   Yes [provider]  HYDROcodone -acetaminophen  (NORCO/VICODIN) 5-325 MG tablet Take 1-2 tablets by mouth every 6 (six) hours as needed. Patient not taking: Reported on 06/30/2024 03/08/19   Layden, Lindsey A, PA-C  nitroGLYCERIN (NITROSTAT) 0.4 MG SL tablet Place 0.4 mg under the tongue every 5 (five) minutes as needed for chest pain.    [provider]    ROS: All other systems have been reviewed and were otherwise negative with the exception of those mentioned in the HPI and as above.  Physical Exam: General: Alert, no acute distress Cardiovascular: No pedal edema Respiratory: No cyanosis, no use of accessory musculature GI: No organomegaly, abdomen is soft and non-tender Skin: No lesions in the area of chief complaint Neurologic: Sensation intact distally Psychiatric: Patient is competent for consent with normal mood and affect Lymphatic: No axillary or cervical lymphadenopathy  MUSCULOSKELETAL:  Active forward elevation to about 30 degrees, passively I can get him to 90 degrees-limited by pain. He is tender to palpation over the posterior aspect of the scapular spine.   Imaging: CT and  X-rays are reviewed.  These demonstrate an atypically high base plate with inferior notching of the glenoid. There is obvious erosion on the CT at the inferior portion of the glenoid. There is a scapular spine fracture, quite medial. This is likely from impingement.  BMI: Estimated body mass index is 33.36 kg/m as calculated from the following:   Height as of 07/02/24: 5' 7 (1.702 m).   Weight as of 07/02/24: 96.6 kg.  Lab Results  Component Value Date   ALBUMIN  3.6 07/02/2024   Diabetes: Patient does not have a diagnosis of diabetes.  Smoking Status:      Assessment: displaced fracture of body of left scapula, Complications due to internal orthopedic device, implant, and graft  Plan: Plan for Procedure(s): REVISION, REVERSE TOTAL ARTHROPLASTY, SHOULDER OPEN REDUCTION INTERNAL FIXATION (ORIF) SHOULDER FRACTURE  The patient has a complex issue. At this point, he would likely need a revision of his implants to move his base plate inferior to avoid continued impingement. We would likely revise the humerus as well. We will take cultures at the time of surgery. Additionally, we would have to do a open reduction and internal fixation of the scapula. The risks, benefits and concerns were discussed at length. He understands there is a high risk for failure as well as instability.   The risks benefits and alternatives were discussed with the patient including but not limited to the risks of nonoperative treatment, versus surgical intervention including infection, bleeding, nerve injury,  blood clots, cardiopulmonary complications, morbidity, mortality, among others, and they were willing to proceed.   We additionally specifically discussed risks of axillary nerve injury, infection, periprosthetic fracture, continued pain and longevity of implants prior to beginning procedure.    Patient will be admitted for inpatient treatment for surgery, pain control, OT, prophylactic antibiotics, VTE  prophylaxis, and discharge planning. The patient is planning to be discharged home with outpatient PT.   The patient acknowledged the explanation, agreed to proceed with the plan and consent was signed.   Operative Plan: see above Discharge Medications: standard DVT Prophylaxis: aspirin  Physical Therapy: delayed therapy Special Discharge needs: Sling (should bring with him). IceMan   Aleck LOISE Stalling, PA-C  07/06/2024 7:46 AM

## 2024-07-07 ENCOUNTER — Inpatient Hospital Stay (HOSPITAL_COMMUNITY)

## 2024-07-07 ENCOUNTER — Other Ambulatory Visit: Payer: Self-pay

## 2024-07-07 ENCOUNTER — Inpatient Hospital Stay (HOSPITAL_COMMUNITY): Admitting: Physician Assistant

## 2024-07-07 ENCOUNTER — Inpatient Hospital Stay (HOSPITAL_COMMUNITY): Admitting: Anesthesiology

## 2024-07-07 ENCOUNTER — Encounter (HOSPITAL_COMMUNITY): Payer: Self-pay | Admitting: Orthopaedic Surgery

## 2024-07-07 ENCOUNTER — Encounter (HOSPITAL_COMMUNITY)
Admission: RE | Disposition: A | Discharge status: Home / Self Care | Payer: Self-pay | Source: Home / Self Care | Attending: Orthopaedic Surgery

## 2024-07-07 ENCOUNTER — Inpatient Hospital Stay (HOSPITAL_COMMUNITY)
Admission: RE | Admit: 2024-07-07 | Discharge: 2024-07-08 | DRG: 483 | Disposition: A | Discharge status: Home / Self Care | Attending: Orthopaedic Surgery | Admitting: Orthopaedic Surgery

## 2024-07-07 DIAGNOSIS — Z87891 Personal history of nicotine dependence: Secondary | ICD-10-CM

## 2024-07-07 DIAGNOSIS — Z888 Allergy status to other drugs, medicaments and biological substances status: Secondary | ICD-10-CM

## 2024-07-07 DIAGNOSIS — T84098A Other mechanical complication of other internal joint prosthesis, initial encounter: Secondary | ICD-10-CM

## 2024-07-07 DIAGNOSIS — Z7901 Long term (current) use of anticoagulants: Secondary | ICD-10-CM | POA: Diagnosis not present

## 2024-07-07 DIAGNOSIS — S42112A Displaced fracture of body of scapula, left shoulder, initial encounter for closed fracture: Secondary | ICD-10-CM | POA: Diagnosis present

## 2024-07-07 DIAGNOSIS — T849XXA Unspecified complication of internal orthopedic prosthetic device, implant and graft, initial encounter: Secondary | ICD-10-CM | POA: Diagnosis present

## 2024-07-07 DIAGNOSIS — Y792 Prosthetic and other implants, materials and accessory orthopedic devices associated with adverse incidents: Secondary | ICD-10-CM | POA: Diagnosis present

## 2024-07-07 DIAGNOSIS — Z96611 Presence of right artificial shoulder joint: Secondary | ICD-10-CM | POA: Diagnosis present

## 2024-07-07 DIAGNOSIS — I739 Peripheral vascular disease, unspecified: Secondary | ICD-10-CM | POA: Diagnosis present

## 2024-07-07 DIAGNOSIS — I2510 Atherosclerotic heart disease of native coronary artery without angina pectoris: Secondary | ICD-10-CM

## 2024-07-07 DIAGNOSIS — Z955 Presence of coronary angioplasty implant and graft: Secondary | ICD-10-CM | POA: Diagnosis not present

## 2024-07-07 DIAGNOSIS — Z7982 Long term (current) use of aspirin: Secondary | ICD-10-CM | POA: Diagnosis not present

## 2024-07-07 DIAGNOSIS — Z85828 Personal history of other malignant neoplasm of skin: Secondary | ICD-10-CM

## 2024-07-07 DIAGNOSIS — I10 Essential (primary) hypertension: Secondary | ICD-10-CM

## 2024-07-07 DIAGNOSIS — Z91013 Allergy to seafood: Secondary | ICD-10-CM | POA: Diagnosis not present

## 2024-07-07 DIAGNOSIS — Z7902 Long term (current) use of antithrombotics/antiplatelets: Secondary | ICD-10-CM

## 2024-07-07 DIAGNOSIS — Z96653 Presence of artificial knee joint, bilateral: Secondary | ICD-10-CM | POA: Diagnosis present

## 2024-07-07 DIAGNOSIS — K219 Gastro-esophageal reflux disease without esophagitis: Secondary | ICD-10-CM | POA: Diagnosis present

## 2024-07-07 DIAGNOSIS — Z91030 Bee allergy status: Secondary | ICD-10-CM | POA: Diagnosis not present

## 2024-07-07 DIAGNOSIS — Z8673 Personal history of transient ischemic attack (TIA), and cerebral infarction without residual deficits: Secondary | ICD-10-CM | POA: Diagnosis not present

## 2024-07-07 DIAGNOSIS — Z79899 Other long term (current) drug therapy: Secondary | ICD-10-CM

## 2024-07-07 DIAGNOSIS — J449 Chronic obstructive pulmonary disease, unspecified: Secondary | ICD-10-CM | POA: Diagnosis present

## 2024-07-07 DIAGNOSIS — Z96612 Presence of left artificial shoulder joint: Principal | ICD-10-CM

## 2024-07-07 HISTORY — PX: ORIF SHOULDER FRACTURE: SHX5035

## 2024-07-07 HISTORY — PX: REVISION TOTAL SHOULDER TO REVERSE TOTAL SHOULDER: SHX6313

## 2024-07-07 LAB — TYPE AND SCREEN
ABO/RH(D): A POS
Antibody Screen: NEGATIVE

## 2024-07-07 LAB — ABO/RH: ABO/RH(D): A POS

## 2024-07-07 SURGERY — REVISION, REVERSE TOTAL ARTHROPLASTY, SHOULDER
Anesthesia: General | Site: Shoulder | Laterality: Left

## 2024-07-07 MED ORDER — CHLORHEXIDINE GLUCONATE 0.12 % MT SOLN
15.0000 mL | Freq: Once | OROMUCOSAL | Status: AC
  Administered 2024-07-07: 15 mL via OROMUCOSAL

## 2024-07-07 MED ORDER — BUPIVACAINE HCL (PF) 0.25 % IJ SOLN
INTRAMUSCULAR | Status: AC
  Filled 2024-07-07: qty 30

## 2024-07-07 MED ORDER — CEFAZOLIN SODIUM-DEXTROSE 2-4 GM/100ML-% IV SOLN
2.0000 g | INTRAVENOUS | Status: AC
  Administered 2024-07-07: 2 g via INTRAVENOUS
  Filled 2024-07-07: qty 100

## 2024-07-07 MED ORDER — OXYCODONE HCL 5 MG PO TABS: 10.0000 mg | ORAL_TABLET | ORAL | Status: DC | PRN

## 2024-07-07 MED ORDER — OXYCODONE HCL 5 MG PO TABS
5.0000 mg | ORAL_TABLET | ORAL | Status: DC | PRN
  Administered 2024-07-08 (×2): 5 mg via ORAL
  Filled 2024-07-07 (×2): qty 1

## 2024-07-07 MED ORDER — EPHEDRINE 5 MG/ML INJ
INTRAVENOUS | Status: AC
  Filled 2024-07-07: qty 5

## 2024-07-07 MED ORDER — METHOCARBAMOL 500 MG PO TABS
500.0000 mg | ORAL_TABLET | Freq: Four times a day (QID) | ORAL | Status: DC | PRN
  Administered 2024-07-08: 500 mg via ORAL
  Filled 2024-07-07: qty 1

## 2024-07-07 MED ORDER — VANCOMYCIN HCL 1000 MG IV SOLR
INTRAVENOUS | Status: AC
  Filled 2024-07-07: qty 20

## 2024-07-07 MED ORDER — PHENOL 1.4 % MT LIQD: 1.0000 | OROMUCOSAL | Status: DC | PRN

## 2024-07-07 MED ORDER — ROCURONIUM BROMIDE 100 MG/10ML IV SOLN
INTRAVENOUS | Status: DC | PRN
  Administered 2024-07-07: 50 mg via INTRAVENOUS
  Administered 2024-07-07: 10 mg via INTRAVENOUS
  Administered 2024-07-07: 20 mg via INTRAVENOUS

## 2024-07-07 MED ORDER — ACETAMINOPHEN 325 MG PO TABS: 325.0000 mg | ORAL_TABLET | Freq: Four times a day (QID) | ORAL | Status: DC | PRN

## 2024-07-07 MED ORDER — MUPIROCIN 2 % EX OINT
1.0000 | TOPICAL_OINTMENT | Freq: Two times a day (BID) | CUTANEOUS | 0 refills | Status: AC
Start: 2024-07-07 — End: 2024-08-06

## 2024-07-07 MED ORDER — ALFUZOSIN HCL ER 10 MG PO TB24
10.0000 mg | ORAL_TABLET | Freq: Every day | ORAL | Status: DC
  Administered 2024-07-08: 10 mg via ORAL
  Filled 2024-07-07: qty 1

## 2024-07-07 MED ORDER — PRAVASTATIN SODIUM 40 MG PO TABS
40.0000 mg | ORAL_TABLET | Freq: Every day | ORAL | Status: DC
  Administered 2024-07-08: 40 mg via ORAL
  Filled 2024-07-07: qty 2
  Filled 2024-07-07: qty 1

## 2024-07-07 MED ORDER — SODIUM CHLORIDE 0.9 % IR SOLN
Status: DC | PRN
  Administered 2024-07-07: 1000 mL

## 2024-07-07 MED ORDER — DIPHENHYDRAMINE HCL 12.5 MG/5ML PO ELIX: 12.5000 mg | ORAL_SOLUTION | ORAL | Status: DC | PRN

## 2024-07-07 MED ORDER — ONDANSETRON HCL 4 MG/2ML IJ SOLN: 4.0000 mg | Freq: Once | INTRAMUSCULAR | Status: DC | PRN

## 2024-07-07 MED ORDER — ONDANSETRON HCL 4 MG PO TABS: 4.0000 mg | ORAL_TABLET | Freq: Four times a day (QID) | ORAL | Status: DC | PRN

## 2024-07-07 MED ORDER — FENTANYL CITRATE (PF) 100 MCG/2ML IJ SOLN
INTRAMUSCULAR | Status: DC | PRN
  Administered 2024-07-07 (×2): 50 ug via INTRAVENOUS

## 2024-07-07 MED ORDER — ONDANSETRON HCL 4 MG/2ML IJ SOLN
INTRAMUSCULAR | Status: DC | PRN
  Administered 2024-07-07: 4 mg via INTRAVENOUS

## 2024-07-07 MED ORDER — PANTOPRAZOLE SODIUM 40 MG PO TBEC
40.0000 mg | DELAYED_RELEASE_TABLET | Freq: Every day | ORAL | Status: DC
  Administered 2024-07-07 – 2024-07-08 (×2): 40 mg via ORAL
  Filled 2024-07-07 (×2): qty 1

## 2024-07-07 MED ORDER — CEFAZOLIN SODIUM-DEXTROSE 2-4 GM/100ML-% IV SOLN
2.0000 g | Freq: Four times a day (QID) | INTRAVENOUS | Status: AC
  Administered 2024-07-07 – 2024-07-08 (×2): 2 g via INTRAVENOUS
  Filled 2024-07-07 (×2): qty 100

## 2024-07-07 MED ORDER — OXYCODONE HCL 5 MG PO TABS: 5.0000 mg | ORAL_TABLET | Freq: Once | ORAL | Status: DC | PRN

## 2024-07-07 MED ORDER — ACETAMINOPHEN 500 MG PO TABS
1000.0000 mg | ORAL_TABLET | Freq: Once | ORAL | Status: AC
  Administered 2024-07-07: 1000 mg via ORAL
  Filled 2024-07-07: qty 2

## 2024-07-07 MED ORDER — STERILE WATER FOR IRRIGATION IR SOLN
Status: DC | PRN
  Administered 2024-07-07: 1000 mL

## 2024-07-07 MED ORDER — ORAL CARE MOUTH RINSE: 15.0000 mL | Freq: Once | OROMUCOSAL | Status: AC

## 2024-07-07 MED ORDER — BUPIVACAINE HCL (PF) 0.5 % IJ SOLN
INTRAMUSCULAR | Status: DC | PRN
  Administered 2024-07-07: 15 mL via PERINEURAL

## 2024-07-07 MED ORDER — ONDANSETRON HCL 4 MG/2ML IJ SOLN
INTRAMUSCULAR | Status: AC
  Filled 2024-07-07: qty 2

## 2024-07-07 MED ORDER — SUGAMMADEX SODIUM 200 MG/2ML IV SOLN
INTRAVENOUS | Status: DC | PRN
Start: 2024-07-07 — End: 2024-07-07
  Administered 2024-07-07: 200 mg via INTRAVENOUS

## 2024-07-07 MED ORDER — MAGNESIUM CITRATE PO SOLN: 1.0000 | Freq: Once | ORAL | Status: DC | PRN

## 2024-07-07 MED ORDER — PHENYLEPHRINE HCL-NACL 20-0.9 MG/250ML-% IV SOLN
INTRAVENOUS | Status: DC | PRN
  Administered 2024-07-07: 50 ug/min via INTRAVENOUS

## 2024-07-07 MED ORDER — BISACODYL 10 MG RE SUPP: 10.0000 mg | Freq: Every day | RECTAL | Status: DC | PRN

## 2024-07-07 MED ORDER — BUPIVACAINE LIPOSOME 1.3 % IJ SUSP
INTRAMUSCULAR | Status: DC | PRN
  Administered 2024-07-07: 10 mL via PERINEURAL

## 2024-07-07 MED ORDER — METOCLOPRAMIDE HCL 5 MG PO TABS: 5.0000 mg | ORAL_TABLET | Freq: Three times a day (TID) | ORAL | Status: DC | PRN

## 2024-07-07 MED ORDER — TRANEXAMIC ACID-NACL 1000-0.7 MG/100ML-% IV SOLN
1000.0000 mg | INTRAVENOUS | Status: AC
  Administered 2024-07-07: 1000 mg via INTRAVENOUS
  Filled 2024-07-07: qty 100

## 2024-07-07 MED ORDER — FENTANYL CITRATE PF 50 MCG/ML IJ SOSY
50.0000 ug | PREFILLED_SYRINGE | INTRAMUSCULAR | Status: DC
  Administered 2024-07-07: 50 ug via INTRAVENOUS
  Filled 2024-07-07: qty 1

## 2024-07-07 MED ORDER — FENTANYL CITRATE (PF) 100 MCG/2ML IJ SOLN
INTRAMUSCULAR | Status: AC
  Filled 2024-07-07: qty 2

## 2024-07-07 MED ORDER — 0.9 % SODIUM CHLORIDE (POUR BTL) OPTIME
TOPICAL | Status: DC | PRN
  Administered 2024-07-07: 1000 mL

## 2024-07-07 MED ORDER — DEXAMETHASONE SODIUM PHOSPHATE 4 MG/ML IJ SOLN
INTRAMUSCULAR | Status: DC | PRN
Start: 2024-07-07 — End: 2024-07-07
  Administered 2024-07-07: 5 mg via INTRAVENOUS

## 2024-07-07 MED ORDER — POLYETHYLENE GLYCOL 3350 17 G PO PACK
17.0000 g | PACK | Freq: Every day | ORAL | Status: DC | PRN
Start: 2024-07-07 — End: 2024-07-08

## 2024-07-07 MED ORDER — FENTANYL CITRATE PF 50 MCG/ML IJ SOSY: 25.0000 ug | PREFILLED_SYRINGE | INTRAMUSCULAR | Status: DC | PRN

## 2024-07-07 MED ORDER — DOCUSATE SODIUM 100 MG PO CAPS
100.0000 mg | ORAL_CAPSULE | Freq: Two times a day (BID) | ORAL | Status: DC
  Administered 2024-07-07 – 2024-07-08 (×2): 100 mg via ORAL
  Filled 2024-07-07 (×2): qty 1

## 2024-07-07 MED ORDER — DEXAMETHASONE SODIUM PHOSPHATE 10 MG/ML IJ SOLN
INTRAMUSCULAR | Status: AC
  Filled 2024-07-07: qty 1

## 2024-07-07 MED ORDER — FINASTERIDE 5 MG PO TABS
5.0000 mg | ORAL_TABLET | Freq: Every day | ORAL | Status: DC
  Administered 2024-07-08: 5 mg via ORAL
  Filled 2024-07-07: qty 1

## 2024-07-07 MED ORDER — VANCOMYCIN HCL 1 G IV SOLR
INTRAVENOUS | Status: DC | PRN
  Administered 2024-07-07: 1000 mg via TOPICAL

## 2024-07-07 MED ORDER — OXYCODONE HCL 5 MG/5ML PO SOLN: 5.0000 mg | Freq: Once | ORAL | Status: DC | PRN

## 2024-07-07 MED ORDER — CHLORHEXIDINE GLUCONATE 4 % EX SOLN: 1.0000 | CUTANEOUS | 1 refills | Status: DC

## 2024-07-07 MED ORDER — METOCLOPRAMIDE HCL 5 MG/ML IJ SOLN: 5.0000 mg | Freq: Three times a day (TID) | INTRAMUSCULAR | Status: DC | PRN

## 2024-07-07 MED ORDER — ALBUMIN HUMAN 5 % IV SOLN: INTRAVENOUS | Status: DC | PRN

## 2024-07-07 MED ORDER — ONDANSETRON HCL 4 MG/2ML IJ SOLN: 4.0000 mg | Freq: Four times a day (QID) | INTRAMUSCULAR | Status: DC | PRN

## 2024-07-07 MED ORDER — EPHEDRINE SULFATE (PRESSORS) 50 MG/ML IJ SOLN
INTRAMUSCULAR | Status: DC | PRN
Start: 2024-07-07 — End: 2024-07-07
  Administered 2024-07-07: 5 mg via INTRAVENOUS
  Administered 2024-07-07: 10 mg via INTRAVENOUS
  Administered 2024-07-07 (×2): 5 mg via INTRAVENOUS

## 2024-07-07 MED ORDER — BUPIVACAINE HCL (PF) 0.25 % IJ SOLN
INTRAMUSCULAR | Status: DC | PRN
  Administered 2024-07-07: 10 mL

## 2024-07-07 MED ORDER — HYDROMORPHONE HCL 1 MG/ML IJ SOLN: 0.5000 mg | INTRAMUSCULAR | Status: DC | PRN

## 2024-07-07 MED ORDER — PHENYLEPHRINE HCL-NACL 20-0.9 MG/250ML-% IV SOLN
INTRAVENOUS | Status: AC
Start: 2024-07-07 — End: 2024-07-07
  Filled 2024-07-07: qty 250

## 2024-07-07 MED ORDER — PROPOFOL 10 MG/ML IV BOLUS
INTRAVENOUS | Status: DC | PRN
  Administered 2024-07-07: 150 mg via INTRAVENOUS

## 2024-07-07 MED ORDER — EZETIMIBE 10 MG PO TABS
10.0000 mg | ORAL_TABLET | Freq: Every day | ORAL | Status: DC
  Administered 2024-07-08: 10 mg via ORAL
  Filled 2024-07-07: qty 1

## 2024-07-07 MED ORDER — LACTATED RINGERS IV SOLN: INTRAVENOUS | Status: DC

## 2024-07-07 MED ORDER — ACETAMINOPHEN 500 MG PO TABS
1000.0000 mg | ORAL_TABLET | Freq: Four times a day (QID) | ORAL | Status: DC
  Administered 2024-07-07 – 2024-07-08 (×3): 1000 mg via ORAL
  Filled 2024-07-07 (×3): qty 2

## 2024-07-07 MED ORDER — MIDAZOLAM HCL 2 MG/2ML IJ SOLN
1.0000 mg | INTRAMUSCULAR | Status: DC
  Administered 2024-07-07: 1 mg via INTRAVENOUS
  Filled 2024-07-07: qty 2

## 2024-07-07 MED ORDER — VANCOMYCIN HCL IN DEXTROSE 1-5 GM/200ML-% IV SOLN: 1000.0000 mg | INTRAVENOUS | Status: DC

## 2024-07-07 MED ORDER — LIDOCAINE HCL (CARDIAC) PF 100 MG/5ML IV SOSY
PREFILLED_SYRINGE | INTRAVENOUS | Status: DC | PRN
Start: 2024-07-07 — End: 2024-07-07
  Administered 2024-07-07: 20 mg via INTRAVENOUS

## 2024-07-07 MED ORDER — LACTATED RINGERS IV SOLN: INTRAVENOUS | Status: DC | PRN

## 2024-07-07 MED ORDER — METHOCARBAMOL 1000 MG/10ML IJ SOLN: 500.0000 mg | Freq: Four times a day (QID) | INTRAMUSCULAR | Status: DC | PRN

## 2024-07-07 MED ORDER — MENTHOL 3 MG MT LOZG: 1.0000 | LOZENGE | OROMUCOSAL | Status: DC | PRN

## 2024-07-07 SURGICAL SUPPLY — 93 items
BAG COUNTER SPONGE SURGICOUNT (BAG) ×1 IMPLANT
BASEPLATE GLENOID RSA 3X25 0D (Shoulder) IMPLANT
BIT DRILL 3.2 PERIPHERAL SCREW (BIT) IMPLANT
BIT DRILL SRG SHRT 2XAO CNCT (BIT) IMPLANT
BIT DRILL SURG QC EVOS LNG 1.8 (DRILL) IMPLANT
BLADE SAW SGTL 73X25 THK (BLADE) ×1 IMPLANT
CHLORAPREP W/TINT 26 (MISCELLANEOUS) ×2 IMPLANT
CLSR STERI-STRIP ANTIMIC 1/2X4 (GAUZE/BANDAGES/DRESSINGS) ×1 IMPLANT
COOLER ICEMAN CLASSIC (MISCELLANEOUS) ×1 IMPLANT
COVER BACK TABLE 60X90IN (DRAPES) ×1 IMPLANT
COVER SURGICAL LIGHT HANDLE (MISCELLANEOUS) ×1 IMPLANT
CUP HUM REV SHLD 3/4 39 +3 (Cup) IMPLANT
DRAPE C-ARM 42X120 X-RAY (DRAPES) ×1 IMPLANT
DRAPE INCISE IOBAN 66X45 STRL (DRAPES) ×1 IMPLANT
DRAPE POUCH INSTRU U-SHP 10X18 (DRAPES) ×1 IMPLANT
DRAPE SHEET LG 3/4 BI-LAMINATE (DRAPES) ×2 IMPLANT
DRAPE SURG ORHT 6 SPLT 77X108 (DRAPES) ×2 IMPLANT
DRAPE TOP 10253 STERILE (DRAPES) ×1 IMPLANT
DRSG AQUACEL AG ADV 3.5X 4 (GAUZE/BANDAGES/DRESSINGS) ×2 IMPLANT
DRSG AQUACEL AG ADV 3.5X 6 (GAUZE/BANDAGES/DRESSINGS) ×1 IMPLANT
DRSG AQUACEL AG ADV 3.5X10 (GAUZE/BANDAGES/DRESSINGS) IMPLANT
DRSG MEPILEX POST OP 4X8 (GAUZE/BANDAGES/DRESSINGS) ×1 IMPLANT
DURAPREP 26ML APPLICATOR (WOUND CARE) ×1 IMPLANT
ELECT BLADE TIP CTD 4 INCH (ELECTRODE) ×1 IMPLANT
ELECT PENCIL ROCKER SW 15FT (MISCELLANEOUS) ×1 IMPLANT
ELECT REM PT RETURN 15FT ADLT (MISCELLANEOUS) ×1 IMPLANT
FACESHIELD WRAPAROUND OR TEAM (MASK) ×3 IMPLANT
GAUZE XEROFORM 1X8 LF (GAUZE/BANDAGES/DRESSINGS) IMPLANT
GEL BONE GRAFT DBM ALLOSYNC 5 (Bone Implant) IMPLANT
GLOVE BIO SURGEON STRL SZ 6.5 (GLOVE) ×2 IMPLANT
GLOVE BIO SURGEON STRL SZ8 (GLOVE) ×2 IMPLANT
GLOVE BIOGEL PI IND STRL 6.5 (GLOVE) ×1 IMPLANT
GLOVE BIOGEL PI IND STRL 8 (GLOVE) ×1 IMPLANT
GLOVE ECLIPSE 8.0 STRL XLNG CF (GLOVE) ×2 IMPLANT
GOWN STRL REUS W/ TWL LRG LVL3 (GOWN DISPOSABLE) ×2 IMPLANT
GUIDEWIRE GLENOID 2.5X220 (WIRE) IMPLANT
KIT BASIN OR (CUSTOM PROCEDURE TRAY) ×1 IMPLANT
KIT STABILIZATION SHOULDER (MISCELLANEOUS) ×1 IMPLANT
KIT TURNOVER KIT A (KITS) ×1 IMPLANT
KWIRE FX150X1.6XTROC PNT (WIRE) IMPLANT
LAVAGE JET IRRISEPT WOUND (IRRIGATION / IRRIGATOR) IMPLANT
MANIFOLD NEPTUNE II (INSTRUMENTS) ×1 IMPLANT
NS IRRIG 1000ML POUR BTL (IV SOLUTION) ×1 IMPLANT
OSTEOTOME THIN 8 3 (MISCELLANEOUS) IMPLANT
PACK SHOULDER (CUSTOM PROCEDURE TRAY) ×1 IMPLANT
PAD COLD SHLDR WRAP-ON (PAD) ×1 IMPLANT
PIN GUIDE 3X75 SHOULDER (PIN) IMPLANT
PLATE FLEX EVOS 2.4X61 10H STL (Plate) IMPLANT
PLATE VA STREN EVOS 2.7X70 10H (Plate) IMPLANT
RESTRAINT HEAD UNIVERSAL NS (MISCELLANEOUS) ×1 IMPLANT
SCREW 5.0X18 (Screw) IMPLANT
SCREW 5.5X26 (Screw) IMPLANT
SCREW BONE 6.5X40 SM (Screw) IMPLANT
SCREW CORT 2.7X10 (Screw) IMPLANT
SCREW CORT 2.7X12 EVOS (Screw) IMPLANT
SCREW CORT 2.7X14 T8 EVOS (Screw) IMPLANT
SCREW EVOS 2.7X11 LOCK T8 (Screw) IMPLANT
SCREW LOCK ST EVOS 2.4X11 STL (Screw) IMPLANT
SCREW LOCK ST EVOS 2.4X13 STL (Screw) IMPLANT
SCREW LOCK ST EVOS 2.4X15 STL (Screw) IMPLANT
SCREW LOCK ST EVOS 2.4X17 (Screw) IMPLANT
SCREW LOCK ST EVOS 2.7X20 (Screw) IMPLANT
SCREW LOCK ST EVOS 2.7X24 (Screw) IMPLANT
SCREW LOCK ST VA EVOS 2.4X14 (Screw) IMPLANT
SCREW PERIPHERAL 30 (Screw) IMPLANT
SET HNDPC FAN SPRY TIP SCT (DISPOSABLE) ×1 IMPLANT
SLING ARM FOAM STRAP LRG (SOFTGOODS) ×1 IMPLANT
SLING ARM FOAM STRAP MED (SOFTGOODS) IMPLANT
SLING ARM FOAM STRAP XLG (SOFTGOODS) IMPLANT
SLING ARM IMMOBILIZER LRG (SOFTGOODS) IMPLANT
SLING ARM IMMOBILIZER MED (SOFTGOODS) IMPLANT
SPACER PERFORM HUM SYS SZ 3/4 (Orthopedic Implant) IMPLANT
SPHERE GLENOD +3X39XLATERALIZE (Shoulder) IMPLANT
SPIKE FLUID TRANSFER (MISCELLANEOUS) IMPLANT
SPONGE T-LAP 4X18 ~~LOC~~+RFID (SPONGE) IMPLANT
STAPLER SKIN PROX 35W (STAPLE) IMPLANT
STEM HUMERAL PLUS LONG SZ3 (Orthopedic Implant) IMPLANT
STRIP CLOSURE SKIN 1/2X4 (GAUZE/BANDAGES/DRESSINGS) ×1 IMPLANT
SUCTION TUBE FRAZIER 12FR DISP (SUCTIONS) IMPLANT
SUT ETHIBOND 2 V 37 (SUTURE) ×1 IMPLANT
SUT ETHIBOND NAB CT1 #1 30IN (SUTURE) ×1 IMPLANT
SUT ETHILON 2 0 PS N (SUTURE) IMPLANT
SUT ETHILON 3 0 PS 1 (SUTURE) IMPLANT
SUT MNCRL AB 4-0 PS2 18 (SUTURE) ×1 IMPLANT
SUT VIC AB 0 CT1 27XBRD ANBCTR (SUTURE) ×1 IMPLANT
SUT VIC AB 0 CT1 36 (SUTURE) ×1 IMPLANT
SUT VIC AB 2-0 SH 27XBRD (SUTURE) IMPLANT
SUT VIC AB 3-0 SH 27X BRD (SUTURE) ×1 IMPLANT
SUTURE FIBERWR #2 38 T-5 BLUE (SUTURE) ×2 IMPLANT
SUTURE FIBERWR #5 38 CONV NDL (SUTURE) ×2 IMPLANT
TOWEL OR 17X26 10 PK STRL BLUE (TOWEL DISPOSABLE) ×1 IMPLANT
TUBE SUCTION HIGH CAP CLEAR NV (SUCTIONS) ×1 IMPLANT
WATER STERILE IRR 1000ML POUR (IV SOLUTION) ×2 IMPLANT

## 2024-07-07 NOTE — Op Note (Signed)
 Orthopaedic Surgery Operative Note (CSN: [id number])  [place]  07-09-1956 Date of Surgery: 07/07/2024   Diagnoses:  Left failed reverse total shoulder arthroplasty and scapula fracture acute  Procedure: Left revision reverse total Shoulder Arthroplasty Mod 22 Left open synovectomy and synovial biopsy Left open reduction internal fixation of scapular fracture   Operative Finding Successful completion of planned procedure.  This is an extremely complicated case due to implants that were atypically positioned aiming superior and the baseplate on the glenoid was placed quite proximal.  This led to a compression type scapular fracture causing a scapular fracture at the base of the scapular spine.  Unfortunately fixing the scapular spine fracture alone would not solve the problem as the implant position would likely cause continued impingement and loss of function.  The patient reported significant pain since his surgery and had inferior scapular notching as well.    This case took an extremely long amount of time as I had to remove the well-fixed implants and change alignment to accommodate for the improved implant position.  The scapular spine fracture was quite medial and this made it challenging to obtain fixation however a dual 9090 plate configuration was used.  The patient has a high risk of complication including but not limited to wound breakdown, loss of fixation, instability amongst others.  There was no obvious purulence however we will hold the patient was a single-stage arthroplasty planning for suppressive antibiotics until cultures result.  Post-operative plan: The patient will be NWB in sling.  The patient will be will be admitted to observation due to medical complexity, monitoring and pain management.  DVT prophylaxis Aspirin  81 mg twice daily for 6 weeks.  Pain control with PRN pain medication preferring oral medicines.  Follow up plan will be scheduled in approximately 7  days for incision check and XR.  Physical therapy to start after 4 weeks.  Implants: Tornier perform humeral size 3 long stem, +3 polyethylene retentive with a 6mm spacer.  25+3 glenosphere, 39+3 baseplate with a 40 center screw and 4 peripheral screws  Post-Op Diagnosis: Same Surgeons:Primary: Cristy Bonner DASEN, MD Assistants:Caroline McBane, PA-C Location: [facility] ROOM 09 Anesthesia: General with Exparel  Interscalene Antibiotics: Ancef  2g preop, Vancomycin  1000mg  locally Tourniquet time: None Estimated Blood Loss: 200 Complications: None Specimens: 3 for culture, hold for 2 weeks rule out C acnes Implants: Implant Name Type Inv. Item Serial No. Manufacturer Lot No. LRB No. Used Action  GEL BONE GRAFT DBM ALLOSYNC 5 - [id number] Bone Implant GEL BONE GRAFT DBM ALLOSYNC 5 [id number] [employer] [id number] Left 1 Implanted  SPHERE GLENOD +3X39XLATERALIZE - [id number] Shoulder SPHERE GLENOD +3X39XLATERALIZE  [employer] [id number] Left 1 Implanted  BASEPLATE GLENOID RSA 3X25 0D - [id number] Shoulder BASEPLATE GLENOID RSA 3X25 0D  [employer] [id number] Left 1 Implanted  SCREW CORT 2.7X10 - [id number] Screw SCREW CORT 2.7X10  [employer]  Left 1 Implanted  SCREW LOCK ST EVOS 2.7X24 - [id number] Screw SCREW LOCK ST EVOS 2.7X24  [employer]  Left 1 Implanted  10H Strength Plate      Left 1 Implanted  SCREW BONE 6.5X40 SM - [id number] Screw SCREW BONE 6.5X40 SM  [employer]  Left 1 Implanted  SCREW PERIPHERAL 30 - [id number] Screw SCREW PERIPHERAL 30  [employer]  Left 1 Implanted  SCREW 5.5X26 - [id number] Screw SCREW 5.5X26  [employer]  Left 1 Implanted  SCREW 5.0X18 - [id number] Screw SCREW 5.0X18  [employer]  Left 1 Implanted  SCREW CORT 2.7X12 EVOS - [id number] Screw SCREW CORT 2.7X12 EVOS  [employer]  Left 1 Implanted  SCREW LOCK ST EVOS 2.7X20 - [id number] Screw SCREW LOCK ST EVOS 2.7X20  [employer]  Left 1 Implanted  SCREW EVOS 2.7X11  LOCK T8 - [id number] Screw SCREW EVOS 2.7X11 LOCK T8  [employer]  Left 2 Implanted  SCREW CORT 2.7X14 T8 EVOS - [id number] Screw SCREW CORT 2.7X14 T8 EVOS  [employer]  Left 2 Implanted  2.4 X 14 LOCK SCREW    [employer]  Left 1 Implanted  2.4 X 11 LOCK SCREW    [employer]  Left 2 Implanted  2.4 X 13 LOCK SCREW    [employer]  Left 1 Implanted  2.4 X 17 LOCK SCREW    [employer]  Left 1 Implanted  2.4 X 15 LOCK SCREW Shoulder   [employer]  Left 1 Implanted  10 H FLEX PLATE    [employer]  Left 1 Implanted  CUP HUM REV SHLD 3/4 39 +3 - [id number] Cup CUP HUM REV SHLD 3/4 39 +3 [id number] [employer]  Left 1 Implanted  STEM HUMERAL PLUS LONG SZ3 - [id number] Orthopedic Implant STEM HUMERAL PLUS LONG SZ3 [id number] [employer]  Left 1 Implanted  SPACER PERFORM HUM SYS SZ 3/4 - [id number] Orthopedic Implant SPACER PERFORM HUM SYS SZ 3/4 [id number] [employer]  Left 1 Implanted    Indications for Surgery:   Coleson Kant is a 68 y.o. male with previous reverse left arthroplasty performed by another provider with impingement style scapular spine fracture and chronic inferior notching.  Benefits and risks of operative and nonoperative management were discussed prior to surgery with patient/guardian(s) and informed consent form was completed.  Infection and need for further surgery were discussed as was prosthetic stability and cuff issues.  We additionally specifically discussed risks of axillary nerve injury, infection, periprosthetic fracture, continued pain and longevity of implants prior to beginning procedure.      Procedure:   The patient was identified in the preoperative holding area where the surgical site was marked. Block placed by anesthesia with exparel .  The patient was taken to the OR where a procedural timeout was called and the above noted anesthesia  was induced.  The patient was positioned beachchair on allen table with spider arm positioner.  Preoperative antibiotics were dosed.  The patient's left shoulder was prepped and draped in the usual sterile fashion.  A second preoperative timeout was called.       We identified the previous deltopectoral incision.  With the skin sharp achieving hemostasis as we progressed.  We identified the cephalic vein and moved laterally bluntly dissecting in the deltopectoral interval.  At that point were able to bluntly dissect down to the clavipectoral fascia remnant and this was opened.  It was clear that there was no intact subscapularis and instead there were loose sutures from previous subscapularis attempted repair.  There is no obvious purulence within the joint however we started our synovectomy and through the entirety of the case performed a complete synovectomy took multiple specimens for culture.  I was able to dislocate the joint using only finger pressure.  The inferior capsule was quite scarred in.  I was able to externally rotate and expose the implants.  There was some fibrinous tissue however no gross purulence.  I was then able  to remove the polyethylene and remove the humeral stem after a small amount of work with a flexible osteotome.  Once this was complete I used the perform humeral instruments to prepare for 3 long stem after re cutting taking about 5-6 more millimeters of bone.  That point I turned my attention of the glenoid.  The glenosphere was removed as well as the 4 peripheral screws.  At that point the glenoid baseplate was removed without issue.  Baseplate was positioned extremely high of the face of the glenoid and there was inferior scapular notching.  Additionally the glenoid was aimed superiorly which accentuated the scapular notching.  I felt that it was inappropriate to try and replace the glenoid in this location as I would still have inferior notching.  Instead I reamed the  prominent portion of the glenoid with the baseplate previously it sat superiorly and instead was able to create an entirely new central boss screw inferiorly while getting near 100% glenoid baseplate seating.  Was able to place a 25+3 baseplate with a 40 center screw and 4 peripheral screws without issue.  There is no inferior peripheral bone that was impinging.  A 39 glenosphere was placed that had 3 mm of lateralization.  At this point we turned attention to the scapular ORIF.  The scapular spine was palpated and the need to skin incision was made just superior to this.  With the skin sharply to hemostasis progressed.  We made a full-thickness skin flap and identified the scapular spine.  We elevated subperiosteally the deltoid and inferior muscular layers.  The able to identify the fracture and cleared hematoma.  The fracture was held reduced with a clamp and a K wire was placed provisionally.  We used a 2.7 mm mini fragment plate from [facility] to obtain fixation on the superior aspect of the fracture obtaining multiple nonlocking screws.  We then used a 90 degree perpendicular plate on the posterior aspect of the scapular spine and obtained multiple locking and nonlocking screws.  Final construct had an anatomic reduction.  We turned our attention back to the reverse shoulder arthroplasty.  We were able to place our trial glenoid implants and reduced the joint obtaining reasonable tension without overtensioning to avoid displacement of her fracture.  I then was able to irrigate again placed Irrisept solution and irrigated once more with 3 L normal saline before placing our final implants.  There is no subscapularis for repair and we instead did not place any FiberWire sutures.  We closed the incisions in the muscular fascial fashion with nonabsorbable suture.  There was a great amount of time spent closing is over 2 relatively large incisions.  Sterile dressing and sling were placed.  Patient awoken  taken PACU in stable condition.   Aleck Stalling, PA-C, present and scrubbed throughout the case, critical for completion in a timely fashion, and for retraction, instrumentation, closure.

## 2024-07-07 NOTE — Anesthesia Procedure Notes (Signed)
 Procedure Name: Intubation Date/Time: 07/07/2024 1:42 PM  Performed by: Dartha Meckel, CRNAPre-anesthesia Checklist: Patient identified, Emergency Drugs available, Suction available and Patient being monitored Patient Re-evaluated:Patient Re-evaluated prior to induction Oxygen Delivery Method: Circle system utilized Preoxygenation: Pre-oxygenation with 100% oxygen Induction Type: IV induction Ventilation: Mask ventilation without difficulty Laryngoscope Size: Glidescope Grade View: Grade I Tube type: Oral Tube size: 7.5 mm Number of attempts: 1 Airway Equipment and Method: Stylet and Oral airway Placement Confirmation: ETT inserted through vocal cords under direct vision, positive ETCO2 and breath sounds checked- equal and bilateral Secured at: 23 cm Tube secured with: Tape Dental Injury: Teeth and Oropharynx as per pre-operative assessment  Difficulty Due To: Difficulty was anticipated and Difficult Airway- due to reduced neck mobility

## 2024-07-07 NOTE — Interval H&P Note (Signed)
 All questions answered, patient wants to proceed with procedure. ? ?

## 2024-07-07 NOTE — Anesthesia Postprocedure Evaluation (Signed)
 Anesthesia Post Note  Patient: [place]  Procedure(s) Performed: REVISION, REVERSE TOTAL ARTHROPLASTY, SHOULDER (Left: Shoulder) OPEN REDUCTION INTERNAL FIXATION (ORIF) SHOULDER FRACTURE (Left: Shoulder)     Patient location during evaluation: PACU Anesthesia Type: General Level of consciousness: awake and alert Pain management: pain level controlled Vital Signs Assessment: post-procedure vital signs reviewed and stable Respiratory status: spontaneous breathing, nonlabored ventilation, respiratory function stable and patient connected to nasal cannula oxygen Cardiovascular status: stable and blood pressure returned to baseline Anesthetic complications: no   No notable events documented.  Last Vitals:  Vitals:   07/07/24 1645 07/07/24 1700  BP: (!) 131/55   Pulse: 64   Resp: 17   Temp:    SpO2: 95% 94%    Last Pain:  Vitals:   07/07/24 1700  TempSrc:   PainSc: 0-No pain                 Debby FORBES Like

## 2024-07-07 NOTE — Transfer of Care (Signed)
 Immediate Anesthesia Transfer of Care Note  Patient: Roy Caldwell  Procedure(s) Performed: REVISION, REVERSE TOTAL ARTHROPLASTY, SHOULDER (Left: Shoulder) OPEN REDUCTION INTERNAL FIXATION (ORIF) SHOULDER FRACTURE (Left: Shoulder)  Patient Location: PACU  Anesthesia Type:General  Level of Consciousness: awake and oriented  Airway & Oxygen Therapy: Patient Spontanous Breathing and Patient connected to nasal cannula oxygen  Post-op Assessment: Report given to RN and Post -op Vital signs reviewed and stable  Post vital signs: Reviewed and stable  Last Vitals:  Vitals Value Taken Time  BP 132/56 07/07/24 16:38  Temp    Pulse 70 07/07/24 16:40  Resp 19 07/07/24 16:40  SpO2 94 % 07/07/24 16:40  Vitals shown include unfiled device data.  Last Pain:  Vitals:   07/07/24 1153  TempSrc:   PainSc: 0-No pain         Complications: No notable events documented.

## 2024-07-07 NOTE — Anesthesia Procedure Notes (Signed)
 Anesthesia Regional Block: Interscalene brachial plexus block   Pre-Anesthetic Checklist: , timeout performed,  Correct Patient, Correct Site, Correct Laterality,  Correct Procedure, Correct Position, site marked,  Risks and benefits discussed,  Surgical consent,  Pre-op evaluation,  At surgeon's request and post-op pain management  Laterality: Left  Prep: chloraprep       Needles:  Injection technique: Single-shot  Needle Type: Echogenic Needle     Needle Length: 5cm  Needle Gauge: 21     Additional Needles:   Narrative:  Start time: 07/07/2024 1:01 PM End time: 07/07/2024 1:04 PM Injection made incrementally with aspirations every 5 mL.  Performed by: Personally  Anesthesiologist: Lucious Debby BRAVO, MD  Additional Notes: No pain on injection. No increased resistance to injection. Injection made in 5cc increments. Good needle visualization. Patient tolerated the procedure well.

## 2024-07-08 MED ORDER — ONDANSETRON HCL 4 MG PO TABS: 4.0000 mg | ORAL_TABLET | Freq: Three times a day (TID) | ORAL | 0 refills | Status: AC | PRN

## 2024-07-08 MED ORDER — METHOCARBAMOL 500 MG PO TABS
500.0000 mg | ORAL_TABLET | Freq: Three times a day (TID) | ORAL | 0 refills | Status: DC | PRN
Start: 2024-07-08 — End: 2024-07-27

## 2024-07-08 MED ORDER — DICLOFENAC SODIUM 75 MG PO TBEC
75.0000 mg | DELAYED_RELEASE_TABLET | Freq: Two times a day (BID) | ORAL | 0 refills | Status: AC
Start: 2024-07-08 — End: 2024-07-22

## 2024-07-08 MED ORDER — GABAPENTIN 300 MG PO CAPS: 300.0000 mg | ORAL_CAPSULE | Freq: Three times a day (TID) | ORAL | 0 refills | Status: AC

## 2024-07-08 MED ORDER — ACETAMINOPHEN 500 MG PO TABS: 500.0000 mg | ORAL_TABLET | Freq: Three times a day (TID) | ORAL | 0 refills | Status: DC | PRN

## 2024-07-08 MED ORDER — AMOXICILLIN 500 MG PO TABS: 500.0000 mg | ORAL_TABLET | Freq: Two times a day (BID) | ORAL | 0 refills | Status: AC

## 2024-07-08 NOTE — Progress Notes (Signed)
   07/08/24 1037  TOC Brief Assessment  Insurance and Status Reviewed  Patient has primary care physician Yes  Home environment has been reviewed home with spouse  Prior level of function: independent  Prior/Current Home Services No current home services  Social Drivers of Health Review SDOH reviewed no interventions necessary  Readmission risk has been reviewed Yes  Transition of care needs no transition of care needs at this time

## 2024-07-08 NOTE — Discharge Instructions (Signed)
 Bonner Hair MD, MPH Aleck Stalling, PA-C [facility] [address] [phone] (tel)   [phone] (fax)   POST-OPERATIVE INSTRUCTIONS - TOTAL SHOULDER REPLACEMENT    WOUND CARE You may leave the operative dressing in place until your follow-up appointment. KEEP THE INCISIONS CLEAN AND DRY. There may be a small amount of fluid/bleeding leaking at the surgical site. This is normal after surgery.  If it fills with liquid or blood please call us  immediately to change it for you. Use the provided ice machine or Ice packs as often as possible for the first 3-4 days, then as needed for pain relief.   Keep a layer of cloth or a shirt between your skin and the cooling unit to prevent frost bite as it can get very cold.  SHOWERING: - You may shower on Post-Op Day #2.  - The dressing is water  resistant but do not scrub it as it may start to peel up.   - You may remove the sling for showering - Gently pat the area dry.  - Do not soak the shoulder in water .  - Do not go swimming in the pool or ocean until your incision has completely healed (about 4-6 weeks after surgery) - KEEP THE INCISIONS CLEAN AND DRY.  EXERCISES Wear the sling at all times  You may remove the sling for showering, but keep the arm across the chest or in a secondary sling.    Accidental/Purposeful External Rotation and shoulder flexion (reaching behind you) is to be avoided at all costs for the first month. It is ok to come out of your sling if your are sitting and have assistance for eating.   Do not lift anything heavier than 1 pound until we discuss it further in clinic.  It is normal for your fingers/hand to become more swollen after surgery and discolored from bruising.   This will resolve over the first few weeks usually after surgery. Please continue to ambulate and do not stay sitting or lying for too long.  Perform foot and wrist pumps to assist in circulation.  PHYSICAL  THERAPY - No therapy for 4 weeks  REGIONAL ANESTHESIA (NERVE BLOCKS) The anesthesia team may have performed a nerve block for you this is a great tool used to minimize pain.   The block may start wearing off overnight (between 8-24 hours postop) When the block wears off, your pain may go from nearly zero to the pain you would have had postop without the block. This is an abrupt transition but nothing dangerous is happening.   This can be a challenging period but utilize your as needed pain medications to try and manage this period. We suggest you use the pain medication the first night prior to going to bed, to ease this transition.  You may take an extra dose of narcotic when this happens if needed   POST-OP MEDICATIONS- Multimodal approach to pain control In general your pain will be controlled with a combination of substances.  Prescriptions unless otherwise discussed are electronically sent to your pharmacy.  This is a carefully made plan we use to minimize narcotic use.     Diclofenac  - Anti-inflammatory medication taken on a scheduled basis Acetaminophen  - Non-narcotic pain medicine taken on a scheduled basis  Gabapentin  - this is to help with nerve based pain, take on a scheduled basis Robaxin  - this is a muscle relaxer, take as needed for muscle spasms Zofran  -  take as needed for  nausea Amoxicillin  - this is an antibiotic, take as instructed for 2 weeks  - You will continue your daily pain medication as you are normally prescribed  FOLLOW-UP If you develop a Fever (>101.5), Redness or Drainage from the surgical incision site, please call our office to arrange for an evaluation. Please call the office to schedule a follow-up appointment for a wound check, 7-10 days post-operatively.  IF YOU HAVE ANY QUESTIONS, PLEASE FEEL FREE TO CALL OUR OFFICE.  HELPFUL INFORMATION  Your arm will be in a sling following surgery. You will be in this sling for the next 4 weeks.   You may be  more comfortable sleeping in a semi-seated position the first few nights following surgery.  Keep a pillow propped under the elbow and forearm for comfort.  If you have a recliner type of chair it might be beneficial.  If not that is fine too, but it would be helpful to sleep propped up with pillows behind your operated shoulder as well under your elbow and forearm.  This will reduce pulling on the suture lines.  When dressing, put your operative arm in the sleeve first.  When getting undressed, take your operative arm out last.  Loose fitting, button-down shirts are recommended.  In most states it is against the law to drive while your arm is in a sling. And certainly against the law to drive while taking narcotics.  You may return to work/school in the next couple of days when you feel up to it. Desk work and typing in the sling is fine.  We suggest you use the pain medication the first night prior to going to bed, in order to ease any pain when the anesthesia wears off. You should avoid taking pain medications on an empty stomach as it will make you nauseous.  You should wean off your narcotic medicines as soon as you are able.     Most patients will be off narcotics before their first postop appointment.   Do not drink alcoholic beverages or take illicit drugs when taking pain medications.  Pain medication may make you constipated.  Below are a few solutions to try in this order: Decrease the amount of pain medication if you aren't having pain. Drink lots of decaffeinated fluids. Drink prune juice and/or each dried prunes  If the first 3 don't work start with additional solutions Take Colace - an over-the-counter stool softener Take Senokot - an over-the-counter laxative Take Miralax  - a stronger over-the-counter laxative   Dental Antibiotics:  We require dental prophylaxis for 2 years after a shoulder replacement  Contact your surgeon for an antibiotic prescription, prior to your  dental procedure.   For more information including helpful videos and documents visit our website:   [email or web address]

## 2024-07-08 NOTE — Plan of Care (Signed)
  Problem: Activity: Goal: Risk for activity intolerance will decrease Outcome: Progressing   Problem: Safety: Goal: Ability to remain free from injury will improve Outcome: Progressing   Problem: Pain Managment: Goal: General experience of comfort will improve and/or be controlled Outcome: Progressing

## 2024-07-08 NOTE — Progress Notes (Signed)
   ORTHOPAEDIC PROGRESS NOTE  s/p Procedure(s): REVISION, REVERSE TOTAL ARTHROPLASTY, SHOULDER OPEN REDUCTION INTERNAL FIXATION (ORIF) SHOULDER FRACTURE  SUBJECTIVE: Reports mild pain about operative site. Nerve block is still working. Abduction pillow was uncomfortable on his sling.   No chest pain. No SOB. No nausea/vomiting. No other complaints.  OBJECTIVE: PE: General: resting in hospital bed, NAD LUE: Dressings CDI and sling well fitting,  full and painless ROM throughout hand with DPC of 0.  Axillary nerve sensation/motor altered in setting of block and unable to be fully tested.  Distal motor and sensory altered in setting of block.   Vitals:   07/08/24 0120 07/08/24 0320  BP: 131/86 (!) 124/59  Pulse: 72 74  Resp: 17 18  Temp: 98.7 F (37.1 C) 99.1 F (37.3 C)  SpO2: 98% 94%    Opiates Today (MME): Today's  total administered Morphine Milligram Equivalents: 15 Opiates Yesterday (MME): Yesterday's total administered Morphine Milligram Equivalents: 45 Opiates Used in the last two days:  Inpatient Morphine Milligram Equivalents Per Day 8/27 - 8/28   Values displayed are in units of MME/Day    Order Start / End Date Yesterday Today    oxyCODONE  (Oxy IR/ROXICODONE ) immediate release tablet 5 mg 8/27 - 8/27 0 of Unknown --    oxyCODONE  (ROXICODONE ) 5 MG/5ML solution 5 mg 8/27 - 8/27 0 of Unknown --      Group total: 0 of Unknown     fentaNYL  (SUBLIMAZE ) injection 25-50 mcg 8/27 - 8/27 0 of 45-90 --    fentaNYL  (SUBLIMAZE ) injection 50-100 mcg 8/27 - 8/27 15 of 15-30 --    fentaNYL  (SUBLIMAZE ) injection 8/27 - 8/27 *30 of 30 --    HYDROmorphone  (DILAUDID ) injection 0.5 mg 8/27 - No end date 0 of 20 0 of 60    oxyCODONE  (Oxy IR/ROXICODONE ) immediate release tablet 5-10 mg 8/27 - No end date 0 of 15-30 15 of 45-90    oxyCODONE  (Oxy IR/ROXICODONE ) immediate release tablet 10-15 mg 8/27 - No end date 0 of 30-45 0 of 90-135    Daily Totals  * 45 of Unknown (at least 155-245)  15 of 195-285  *One-Step medication  Calculation Errors     Order Type Date Details   oxyCODONE  (Oxy IR/ROXICODONE ) immediate release tablet 5 mg Ordered Dose -- Insufficient frequency information   oxyCODONE  (ROXICODONE ) 5 MG/5ML solution 5 mg Ordered Dose -- Insufficient frequency information           Stable post-op images.   ASSESSMENT: Roy Caldwell is a 68 y.o. male POD#1  PLAN: Weightbearing: NWB LUE Insicional and dressing care: Reinforce dressings as needed Orthopedic device(s): Sling Showering: post-op day #2  VTE prophylaxis: Resume aspirin  Pain control: PRN pain medication, minimize narcotics as able. Will resume daily pain medication at discharge.  Follow - up plan: 1 week in office Dispo: Discharge home today.  Contact information:  Weekdays 8-5 Dr. Bonner Hair, Aleck Stalling PA-C, After hours and holidays please check [email or web address] for group call information for [facility]  Aleck Stalling, PA-C 07/08/24

## 2024-07-08 NOTE — Plan of Care (Signed)
  Problem: Pain Management: Goal: Pain level will decrease with appropriate interventions Outcome: Progressing   Problem: Education: Goal: Knowledge of the prescribed therapeutic regimen will improve Outcome: Progressing   Problem: Safety: Goal: Ability to remain free from injury will improve Outcome: Progressing   Problem: Elimination: Goal: Will not experience complications related to urinary retention Outcome: Progressing

## 2024-07-08 NOTE — Discharge Summary (Signed)
 Patient ID: Roy Caldwell MRN: [id number] DOB/AGE: 13-Dec-1955 68 y.o.  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  Tolerated procedure well.  Was kept for monitoring overnight for pain control and medical monitoring postop and was found to be stable for DC home the morning after surgery.  Patient was instructed on specific activity restrictions and all questions were answered.   Opiates Today (MME): Today's  total administered Morphine Milligram Equivalents: 15 Opiates Yesterday (MME): Yesterday's total administered Morphine Milligram Equivalents: 45 Opiates Used in the last two days:  Inpatient Morphine Milligram Equivalents Per Day 8/27 - 8/28   Values displayed are in units of MME/Day    Order Start / End Date Yesterday Today    oxyCODONE  (Oxy IR/ROXICODONE ) immediate release tablet 5 mg 8/27 - 8/27 0 of Unknown --    oxyCODONE  (ROXICODONE ) 5 MG/5ML solution 5 mg 8/27 - 8/27 0 of Unknown --      Group total: 0 of Unknown     fentaNYL  (SUBLIMAZE ) injection 25-50 mcg 8/27 - 8/27 0 of 45-90 --    fentaNYL  (SUBLIMAZE ) injection 50-100 mcg 8/27 - 8/27 15 of 15-30 --    fentaNYL   (SUBLIMAZE ) injection 8/27 - 8/27 *30 of 30 --    Daily Totals  * 45 of Unknown (at least 90-150) --  *One-Step medication  Calculation Errors     Order Type Date Details   oxyCODONE  (Oxy IR/ROXICODONE ) immediate release tablet 5 mg Ordered Dose -- Insufficient frequency information   oxyCODONE  (ROXICODONE ) 5 MG/5ML solution 5 mg Ordered Dose -- Insufficient frequency information            Consults: None  Significant Diagnostic Studies: No additional pertinent studies  Treatments: Surgery  Discharge Exam: General: resting in hospital bed, NAD LUE: Dressings CDI and sling well fitting,  full and painless ROM throughout hand with DPC of 0.  Axillary nerve sensation/motor altered in setting of block and unable to be fully tested.  Distal motor and sensory altered in setting of block.    Disposition: Discharge disposition: 01-Home or Self Care       Discharge Instructions     Call MD for:  redness, tenderness, or signs of infection (pain, swelling, redness, odor or green/yellow discharge around incision site)   Complete by: As directed    Call MD for:  severe uncontrolled pain   Complete by: As directed    Call MD for:  temperature >100.4   Complete by: As directed    Diet - low sodium heart healthy   Complete by: As directed       Allergies as of 07/08/2024  Reactions   Bee Venom Anaphylaxis, Hives   Shellfish Allergy Anaphylaxis, Hives, Dermatitis   Pt can tolerate shrimp   Statins         Medication List     STOP taking these medications    HYDROcodone -acetaminophen  5-325 MG tablet Commonly known as: NORCO/VICODIN       TAKE these medications    acetaminophen  500 MG tablet Commonly known as: TYLENOL  Take 1 tablet (500 mg total) by mouth every 8 (eight) hours as needed for moderate pain (pain score 4-6) or mild pain (pain score 1-3). Do not take more than 4,000 mg of tylenol  daily   alfuzosin  10 MG 24 hr tablet Commonly known as:  UROXATRAL  Take 10 mg by mouth daily.   amoxicillin  500 MG tablet Commonly known as: AMOXIL  Take 1 tablet (500 mg total) by mouth 2 (two) times daily for 14 days.   aspirin  EC 81 MG tablet Take 81 mg by mouth daily. Swallow whole.   chlorhexidine  4 % external liquid Commonly known as: HIBICLENS  Apply 15 mLs (1 Application total) topically as directed for 30 doses. Use as directed daily for 5 days every other week for 6 weeks.   cholecalciferol 25 MCG (1000 UNIT) tablet Commonly known as: VITAMIN D3 Take 1,000 Units by mouth daily.   cilostazol 50 MG tablet Commonly known as: PLETAL Take 50 mg by mouth 2 (two) times daily.   clopidogrel 75 MG tablet Commonly known as: PLAVIX Take 75 mg by mouth daily.   cyanocobalamin 500 MCG tablet Commonly known as: VITAMIN B12 Take 500 mcg by mouth daily.   diclofenac  75 MG EC tablet Commonly known as: VOLTAREN  Take 1 tablet (75 mg total) by mouth 2 (two) times daily for 14 days.   ezetimibe  10 MG tablet Commonly known as: ZETIA  Take 10 mg by mouth daily.   finasteride  5 MG tablet Commonly known as: PROSCAR  Take 5 mg by mouth daily.   gabapentin  300 MG capsule Commonly known as: Neurontin  Take 1 capsule (300 mg total) by mouth 3 (three) times daily for 14 days. For 2 weeks post op for pain.   methocarbamol  500 MG tablet Commonly known as: ROBAXIN  Take 1 tablet (500 mg total) by mouth every 8 (eight) hours as needed for muscle spasms.   multivitamin with minerals Tabs tablet Take 1 tablet by mouth daily.   mupirocin  ointment 2 % Commonly known as: BACTROBAN  Place 1 Application into the nose 2 (two) times daily for 60 doses. Use as directed 2 times daily for 5 days every other week for 6 weeks.   nitroGLYCERIN 0.4 MG SL tablet Commonly known as: NITROSTAT Place 0.4 mg under the tongue every 5 (five) minutes as needed for chest pain.   omeprazole 20 MG capsule Commonly known as: PRILOSEC Take 20-40 mg by mouth See admin  instructions. Take 20 mg by mouth every other day, alternating with 40 mg on alternate days   ondansetron  4 MG tablet Commonly known as: Zofran  Take 1 tablet (4 mg total) by mouth every 8 (eight) hours as needed for up to 7 days for nausea or vomiting.   pravastatin  40 MG tablet Commonly known as: PRAVACHOL  Take 40 mg by mouth daily.         Aleck Stalling, PA-C 07/08/2024

## 2024-07-09 ENCOUNTER — Encounter (HOSPITAL_COMMUNITY): Payer: Self-pay | Admitting: Orthopaedic Surgery

## 2024-07-21 ENCOUNTER — Encounter (HOSPITAL_COMMUNITY): Payer: Self-pay | Admitting: Orthopaedic Surgery

## 2024-07-21 ENCOUNTER — Other Ambulatory Visit: Payer: Self-pay

## 2024-07-21 NOTE — Patient Instructions (Addendum)
 SURGICAL WAITING ROOM VISITATION  Patients having surgery or a procedure may have no more than 2 support people in the waiting area - these visitors may rotate.    Children under the age of 31 must have an adult with them who is not the patient.  Visitors with respiratory illnesses are discouraged from visiting and should remain at home.  If the patient needs to stay at the hospital during part of their recovery, the visitor guidelines for inpatient rooms apply. Pre-op nurse will coordinate an appropriate time for 1 support person to accompany patient in pre-op.  This support person may not rotate.    Please refer to the [facility] website for the visitor guidelines for Inpatients (after your surgery is over and you are in a regular room).       Your procedure is scheduled on: Monday, Sept. 15, 2025   Report to [facility] Main Entrance    Report to admitting at  10:30 AM   Call this number if you have problems the morning of surgery [phone]   Do not eat food :After Midnight.   After Midnight you may have the following liquids until 10:00 AM DAY OF SURGERY  Water  Non-Citrus Juices (without pulp, NO RED-Apple, White grape, White cranberry) Black Coffee (NO MILK/CREAM OR CREAMERS, sugar ok)  Clear Tea (NO MILK/CREAM OR CREAMERS, sugar ok) regular and decaf                             Plain Jell-O (NO RED)                                           Fruit ices (not with fruit pulp, NO RED)                                     Popsicles (NO RED)                                                               Sports drinks like Gatorade (NO RED)         Oral Hygiene is also important to reduce your risk of infection.                                    Remember - BRUSH YOUR TEETH THE MORNING OF SURGERY WITH YOUR REGULAR TOOTHPASTE  DENTURES WILL BE REMOVED PRIOR TO SURGERY PLEASE DO NOT APPLY Poly grip OR ADHESIVES!!!   Do NOT smoke after Midnight   Stop all vitamins  and herbal supplements 7 days before surgery.   Stop Aspirin , Plavix, Cilostazol as directed by the surgeon's office   Take these medicines the morning of surgery with A SIP OF WATER  Alfuzosin  Finasteride  Omeprazole                              You may not have any metal on your body including jewelry and  body piercing             Do not wear lotions, powders, perfumes/cologne, or deodorant   Do not shave  48 hours prior to surgery.               Men may shave face and neck.   Do not bring valuables to the hospital. [facility] IS NOT             RESPONSIBLE   FOR VALUABLES.   Contacts, glasses, dentures or bridgework may not be worn into surgery.   Bring small overnight bag day of surgery.   DO NOT BRING YOUR HOME MEDICATIONS TO THE HOSPITAL. PHARMACY WILL DISPENSE MEDICATIONS LISTED ON YOUR MEDICATION LIST TO YOU DURING YOUR ADMISSION IN THE HOSPITAL!    Patients discharged on the day of surgery will not be allowed to drive home.  Someone NEEDS to stay with you for the first 24 hours after anesthesia.   Special Instructions: Bring a copy of your healthcare power of attorney and living will documents the day of surgery if you haven't scanned them before.              Please read over the following fact sheets you were given: IF YOU HAVE QUESTIONS ABOUT YOUR PRE-OP INSTRUCTIONS PLEASE CALL [phone].   If you received a COVID test during your pre-op visit  it is requested that you wear a mask when out in public, stay away from anyone that may not be feeling well and notify your surgeon if you develop symptoms. If you test positive for Covid or have been in contact with anyone that has tested positive in the last 10 days please notify you surgeon.    [facility]- Preparing for Total Shoulder Arthroplasty    Before surgery, you can play an important role. Because skin is not sterile, your skin needs to be as free of germs as possible. You can reduce the number of germs on your skin  by using the following products. Benzoyl Peroxide Gel Reduces the number of germs present on the skin Applied twice a day to shoulder area starting two days before surgery    ==================================================================  Please follow these instructions carefully:  BENZOYL PEROXIDE 5% GEL  Please do not use if you have an allergy to benzoyl peroxide.   If your skin becomes reddened/irritated stop using the benzoyl peroxide.  Starting two days before surgery, apply as follows: Apply benzoyl peroxide in the morning and at night. Apply after taking a shower. If you are not taking a shower clean entire shoulder front, back, and side along with the armpit with a clean wet washcloth.  Place a quarter-sized dollop on your shoulder and rub in thoroughly, making sure to cover the front, back, and side of your shoulder, along with the armpit.   2 days before (07/24/2024)  ____ AM   ____ PM              1 day before (07/25/2024)  ____ AM   ____ PM                         Do this twice a day for two days.  (Last application is the night before surgery, AFTER using the CHG soap as described below). Do NOT apply benzoyl peroxide gel on the day of surgery.     Pre-operative 5 CHG Bath Instructions   You can play a key role in reducing  the risk of infection after surgery. Your skin needs to be as free of germs as possible. You can reduce the number of germs on your skin by washing with CHG (chlorhexidine  gluconate) soap before surgery. CHG is an antiseptic soap that kills germs and continues to kill germs even after washing.   DO NOT use if you have an allergy to chlorhexidine /CHG or antibacterial soaps. If your skin becomes reddened or irritated, stop using the CHG and notify one of our RNs at [phone].   Please shower with the CHG soap starting 4 days before surgery using the following schedule: Starting 9/11-9/15/2025   Please keep in mind the following:  DO NOT shave,  including legs and underarms, starting the day of your first shower.   You may shave your face at any point before/day of surgery.  Place clean sheets on your bed the day you start using CHG soap. Use a clean washcloth (not used since being washed) for each shower. DO NOT sleep with pets once you start using the CHG.   CHG Shower Instructions:  If you choose to wash your hair and private area, wash first with your normal shampoo/soap.  After you use shampoo/soap, rinse your hair and body thoroughly to remove shampoo/soap residue.  Turn the water  OFF and apply about 3 tablespoons (45 ml) of CHG soap to a CLEAN washcloth.  Apply CHG soap ONLY FROM YOUR NECK DOWN TO YOUR TOES (washing for 3-5 minutes)  DO NOT use CHG soap on face, private areas, open wounds, or sores.  Pay special attention to the area where your surgery is being performed.  If you are having back surgery, having someone wash your back for you may be helpful. Wait 2 minutes after CHG soap is applied, then you may rinse off the CHG soap.  Pat dry with a clean towel  Put on clean clothes/pajamas   If you choose to wear lotion, please use ONLY the CHG-compatible lotions on the back of this paper.     Additional instructions for the day of surgery: DO NOT APPLY any lotions, deodorants, cologne, or perfumes.   Put on clean/comfortable clothes.  Brush your teeth.  Ask your nurse before applying any prescription medications to the skin.      CHG Compatible Lotions   Aveeno Moisturizing lotion  Cetaphil Moisturizing Cream  Cetaphil Moisturizing Lotion  Clairol Herbal Essence Moisturizing Lotion, Dry Skin  Clairol Herbal Essence Moisturizing Lotion, Extra Dry Skin  Clairol Herbal Essence Moisturizing Lotion, Normal Skin  Curel Age Defying Therapeutic Moisturizing Lotion with Alpha Hydroxy  Curel Extreme Care Body Lotion  Curel Soothing Hands Moisturizing Hand Lotion  Curel Therapeutic Moisturizing Cream, Fragrance-Free   Curel Therapeutic Moisturizing Lotion, Fragrance-Free  Curel Therapeutic Moisturizing Lotion, Original Formula  Eucerin Daily Replenishing Lotion  Eucerin Dry Skin Therapy Plus Alpha Hydroxy Crme  Eucerin Dry Skin Therapy Plus Alpha Hydroxy Lotion  Eucerin Original Crme  Eucerin Original Lotion  Eucerin Plus Crme Eucerin Plus Lotion  Eucerin TriLipid Replenishing Lotion  Keri Anti-Bacterial Hand Lotion  Keri Deep Conditioning Original Lotion Dry Skin Formula Softly Scented  Keri Deep Conditioning Original Lotion, Fragrance Free Sensitive Skin Formula  Keri Lotion Fast Absorbing Fragrance Free Sensitive Skin Formula  Keri Lotion Fast Absorbing Softly Scented Dry Skin Formula  Keri Original Lotion  Keri Skin Renewal Lotion Keri Silky Smooth Lotion  Keri Silky Smooth Sensitive Skin Lotion  Nivea Body Creamy Conditioning Oil  Nivea Body Extra Enriched Education administrator Original  Lotion  Physicist, medical Moisturizing Lotion Nivea Crme  Nivea Skin Firming Lotion  NutraDerm 30 Skin Lotion  NutraDerm Skin Lotion  NutraDerm Therapeutic Skin Cream  NutraDerm Therapeutic Skin Lotion  ProShield Protective Hand Cream  Provon moisturizing lotion    WHAT IS A BLOOD TRANSFUSION? Blood Transfusion Information  A transfusion is the replacement of blood or some of its parts. Blood is made up of multiple cells which provide different functions. Red blood cells carry oxygen and are used for blood loss replacement. White blood cells fight against infection. Platelets control bleeding. Plasma helps clot blood. Other blood products are available for specialized needs, such as hemophilia or other clotting disorders. BEFORE THE TRANSFUSION  Who gives blood for transfusions?  Healthy volunteers who are fully evaluated to make sure their blood is safe. This is blood bank blood. Transfusion therapy is the safest it has ever been in the practice of medicine. Before blood is taken from a donor, a  complete history is taken to make sure that person has no history of diseases nor engages in risky social behavior (examples are intravenous drug use or sexual activity with multiple partners). The donor's travel history is screened to minimize risk of transmitting infections, such as malaria. The donated blood is tested for signs of infectious diseases, such as HIV and hepatitis. The blood is then tested to be sure it is compatible with you in order to minimize the chance of a transfusion reaction. If you or a relative donates blood, this is often done in anticipation of surgery and is not appropriate for emergency situations. It takes many days to process the donated blood. RISKS AND COMPLICATIONS Although transfusion therapy is very safe and saves many lives, the main dangers of transfusion include:  Getting an infectious disease. Developing a transfusion reaction. This is an allergic reaction to something in the blood you were given. Every precaution is taken to prevent this. The decision to have a blood transfusion has been considered carefully by your caregiver before blood is given. Blood is not given unless the benefits outweigh the risks. AFTER THE TRANSFUSION Right after receiving a blood transfusion, you will usually feel much better and more energetic. This is especially true if your red blood cells have gotten low (anemic). The transfusion raises the level of the red blood cells which carry oxygen, and this usually causes an energy increase. The nurse administering the transfusion will monitor you carefully for complications. HOME CARE INSTRUCTIONS  No special instructions are needed after a transfusion. You may find your energy is better. Speak with your caregiver about any limitations on activity for underlying diseases you may have. SEEK MEDICAL CARE IF:  Your condition is not improving after your transfusion. You develop redness or irritation at the intravenous (IV) site. SEEK  IMMEDIATE MEDICAL CARE IF:  Any of the following symptoms occur over the next 12 hours: Shaking chills. You have a temperature by mouth above 102 F (38.9 C), not controlled by medicine. Chest, back, or muscle pain. People around you feel you are not acting correctly or are confused. Shortness of breath or difficulty breathing. Dizziness and fainting. You get a rash or develop hives. You have a decrease in urine output. Your urine turns a dark color or changes to pink, red, or brown. Any of the following symptoms occur over the next 10 days: You have a temperature by mouth above 102 F (38.9 C), not controlled by medicine. Shortness of breath. Weakness after normal activity. The  white part of the eye turns yellow (jaundice). You have a decrease in the amount of urine or are urinating less often. Your urine turns a dark color or changes to pink, red, or brown. Document Released: 10/25/2000 Document Revised: 01/20/2012 Document Reviewed: 06/13/2008 [facility] Patient Information 2014 [place], [place].  _______________________________________________________________________

## 2024-07-21 NOTE — Progress Notes (Signed)
 Sent message, via epic in basket, requesting orders in epic from Careers adviser.

## 2024-07-21 NOTE — Progress Notes (Addendum)
 For Anesthesia: PCP - Franky Ozell Molt, PA-C  Cardiologist -  Darice Earnie Sharps, MD  Clearance in 07/01/24    Bowel Prep reminder:N/A  Chest x-ray - N/A EKG - 07/02/24 in [facility] Stress Test - 03-24-2024 [facility] ECHO - 06-28-2021  Cardiac Cath - 05-25-2021  Pacemaker/ICD device last checked: N/A Pacemaker orders received: N/A Device Rep notified: N/A  Spinal Cord Stimulator: N/A  Sleep Study - N/A CPAP - N/A  Fasting Blood Sugar - N/A Checks Blood Sugar __N/A___ times a day Date and result of last Hgb A1c-N/A  Last dose of GLP1 agonist- N/A GLP1 instructions: Hold 7 days prior to schedule (Hold 24 hours-daily)   Last dose of SGLT-2 inhibitors- N/A SGLT-2 instructions: Hold 72 hours prior to surgery  Blood Thinner Instructions: Plavix last dose 07/19/24 at 7 AM and Cilostazol last dose 07/19/24 at 7 AM Aspirin  Instructions: N/A Last Dose:N/A  Activity level:  activities of daily living without stopping and without chest pain and/or shortness of breath    Anesthesia review: CAD, PVD, CKD, HTN, Stroke, aneurysm of left common iliac artery   Patient denies shortness of breath, fever, cough and chest pain at PAT appointment   Patient verbalized understanding of instructions that were reviewed over the telephone.

## 2024-07-22 LAB — AEROBIC/ANAEROBIC CULTURE W GRAM STAIN (SURGICAL/DEEP WOUND)
Culture: NO GROWTH
Culture: NO GROWTH
Culture: NO GROWTH
Gram Stain: NONE SEEN
Gram Stain: NONE SEEN
Gram Stain: NONE SEEN

## 2024-07-22 NOTE — H&P (Signed)
 PREOPERATIVE H&P  Chief Complaint: Complications due to internal orthopedic device, implant, and graft  HPI: Roy Caldwell is a 68 y.o. male who presents for preoperative history and physical prior to scheduled surgery, Procedure(s): REVISION, REVERSE TOTAL ARTHROPLASTY, SHOULDER.   Patient has a past medical history significant for COPD, HTN, CAD s/p DES LAD, RCA 05/25/2021, CKD, PVD aneurysm of L iliac art, R SFA procedure 2019, TIA 2021 .   Patient ha a revision total shoulder arthroplasty. He did well after surgery. Last Friday, he felt like his shoulder was slipping. He came in to the office on Tuesday for a wound check. Xrays showed a dislocation of the prosthesis.   Symptoms are rated as moderate to severe, and have been worsening.  This is significantly impairing activities of daily living.    Please see clinic note for further details on this patient's care.    He has elected for surgical management.   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 Wadie Counter   EYE SURGERY     FB removal   HERNIA REPAIR     ;umbilical   JOINT REPLACEMENT Right 2015   total shoulder [facility] Dr[facility] Lewanda [facility]   ORIF SHOULDER FRACTURE Left 07/07/2024   Procedure: OPEN REDUCTION INTERNAL FIXATION (ORIF) SHOULDER  FRACTURE;  Surgeon: Cristy Bonner DASEN, MD;  Location: [facility];  Service: Orthopedics;  Laterality: Left;   REPLACEMENT TOTAL KNEE Bilateral    right  2015   left  2023   REVISION TOTAL SHOULDER TO REVERSE TOTAL SHOULDER Left 07/07/2024   Procedure: REVISION, REVERSE TOTAL ARTHROPLASTY, SHOULDER;  Surgeon: Cristy Bonner DASEN, MD;  Location: [facility];  Service: Orthopedics;  Laterality: Left;   TOTAL SHOULDER REPLACEMENT Left 2023   done at [facility]  by Dr. Lewanda   Social History   Socioeconomic History   Marital status: Married    Spouse name: Not on file   Number of children: Not on file   Years of education: Not on file   Highest education level: Not on file  Occupational History   Not on file  Tobacco Use   Smoking status: Former    Types: Cigarettes    Start date: 2023    Quit date: 1973    Years since quitting: 52.7   Smokeless tobacco: Never  Vaping Use   Vaping status: Never Used  Substance and Sexual Activity   Alcohol use: Not Currently   Drug use: Not Currently   Sexual activity: Not Currently  Other Topics Concern   Not on file  Social History Narrative   Not  on file   Social Drivers of Health   Financial Resource Strain: Not on file  Food Insecurity: No Food Insecurity (07/07/2024)   Hunger Vital Sign    Worried About Running Out of Food in the Last Year: Never true    Ran Out of Food in the Last Year: Never true  Transportation Needs: No Transportation Needs (07/07/2024)   PRAPARE - Administrator, Civil Service (Medical): No    Lack of Transportation (Non-Medical): No  Physical Activity: Not on file  Stress: Not on file  Social Connections: Socially Integrated (07/07/2024)   Social Connection and Isolation Panel    Frequency of Communication with Friends and Family: Twice a week    Frequency of Social Gatherings with Friends and Family: Once a week    Attends Religious Services: More than 4 times per year    Active Member of [employer] or Organizations: No     Attends Engineer, structural: More than 4 times per year    Marital Status: Married   History reviewed. No pertinent family history. Allergies  Allergen Reactions   Bee Venom Anaphylaxis and Hives   Shellfish Allergy Anaphylaxis, Hives and Dermatitis    Pt can tolerate shrimp   Statins    Tape Itching   Prior to Admission medications   Medication Sig Start Date End Date Taking? Authorizing Provider  acetaminophen  (TYLENOL ) 500 MG tablet Take 1 tablet (500 mg total) by mouth every 8 (eight) hours as needed for moderate pain (pain score 4-6) or mild pain (pain score 1-3). Do not take more than 4,000 mg of tylenol  daily 07/08/24   Braeson Rupe N, PA-C  alfuzosin  (UROXATRAL ) 10 MG 24 hr tablet Take 10 mg by mouth daily.    [provider]  amoxicillin  (AMOXIL ) 500 MG tablet Take 1 tablet (500 mg total) by mouth 2 (two) times daily for 14 days. 07/08/24 07/22/24  Jennye Aleck SAILOR, PA-C  aspirin  EC 81 MG tablet Take 81 mg by mouth daily. Swallow whole.    [provider]  chlorhexidine  (HIBICLENS ) 4 % external liquid Apply 15 mLs (1 Application total) topically as directed for 30 doses. Use as directed daily for 5 days every other week for 6 weeks. 07/07/24   Deondrea Aguado N, PA-C  cholecalciferol (VITAMIN D3) 25 MCG (1000 UNIT) tablet Take 1,000 Units by mouth daily.    [provider]  cilostazol (PLETAL) 50 MG tablet Take 50 mg by mouth 2 (two) times daily. 04/07/24 04/07/25  [provider]  clopidogrel (PLAVIX) 75 MG tablet Take 75 mg by mouth daily.    [provider]  cyanocobalamin (VITAMIN B12) 500 MCG tablet Take 500 mcg by mouth daily.    [provider]  diclofenac  (VOLTAREN ) 75 MG EC tablet Take 1 tablet (75 mg total) by mouth 2 (two) times daily for 14 days. 07/08/24 07/22/24  Jennye Aleck SAILOR, PA-C  ezetimibe  (ZETIA ) 10 MG tablet Take 10 mg by mouth every evening.    [provider]  finasteride  (PROSCAR ) 5  MG tablet Take 5 mg by mouth daily.    [provider]  gabapentin  (NEURONTIN ) 300 MG capsule Take 1 capsule (300 mg total) by mouth 3 (three) times daily for 14 days. For 2 weeks post op for pain. 07/08/24 07/22/24  Shavonne Ambroise N, PA-C  methocarbamol  (ROBAXIN ) 500 MG tablet Take 1 tablet (500 mg total) by mouth every 8 (eight) hours as needed for muscle spasms. 07/08/24  Oneida Mckamey N, PA-C  Multiple Vitamin (MULTIVITAMIN WITH MINERALS) TABS tablet Take 1 tablet by mouth daily.    [provider]  mupirocin  ointment (BACTROBAN ) 2 % Place 1 Application into the nose 2 (two) times daily for 60 doses. Use as directed 2 times daily for 5 days every other week for 6 weeks. 07/07/24 08/06/24  Nero Sawatzky N, PA-C  nitroGLYCERIN (NITROSTAT) 0.4 MG SL tablet Place 0.4 mg under the tongue every 5 (five) minutes as needed for chest pain.    [provider]  omeprazole (PRILOSEC) 20 MG capsule Take 20-40 mg by mouth See admin instructions. Take 20 mg by mouth every other day, alternating with 40 mg on alternate days 09/17/17   [provider]  pravastatin  (PRAVACHOL ) 40 MG tablet Take 40 mg by mouth daily. Patient not taking: Reported on 07/21/2024    [provider]    ROS: All other systems have been reviewed and were otherwise negative with the exception of those mentioned in the HPI and as above.  Physical Exam: General: Alert, no acute distress Cardiovascular: No pedal edema Respiratory: No cyanosis, no use of accessory musculature GI: No organomegaly, abdomen is soft and non-tender Skin: No lesions in the area of chief complaint Neurologic: Sensation intact distally Psychiatric: Patient is competent for consent with normal mood and affect Lymphatic: No axillary or cervical lymphadenopathy  MUSCULOSKELETAL:  Examination of the left shoulder demonstrates a large hematoma has re-formed, presents with serosanguinous drainage.  Imaging: Xrays  demonstrate a dislocated reverse total shoulder arthroplasty  BMI: Estimated body mass index is 32.11 kg/m as calculated from the following:   Height as of this encounter: 5' 7 (1.702 m).   Weight as of this encounter: 93 kg.  Lab Results  Component Value Date   ALBUMIN  3.6 07/02/2024   Diabetes: Patient does not have a diagnosis of diabetes.     Smoking Status:      Assessment: Complications due to internal orthopedic device, implant, and graft  Plan: Plan for Procedure(s): REVISION, REVERSE TOTAL ARTHROPLASTY, SHOULDER  The risks benefits and alternatives were discussed with the patient including but not limited to the risks of nonoperative treatment, versus surgical intervention including infection, bleeding, nerve injury,  blood clots, cardiopulmonary complications, morbidity, mortality, among others, and they were willing to proceed.   We additionally specifically discussed risks of axillary nerve injury, infection, periprosthetic fracture, continued pain and longevity of implants prior to beginning procedure.    Patient will be admitted for inpatient treatment for surgery, pain control, OT, prophylactic antibiotics, VTE prophylaxis, and discharge planning. The patient is planning to be discharged home with outpatient PT.   The patient acknowledged the explanation, agreed to proceed with the plan and consent was signed.   Operative Plan: Left revision reverse total shoulder arthroplasty and open treatment of dislocation Discharge Medications: standard DVT Prophylaxis: resume Physical Therapy: delayed Special Discharge needs: none   Aleck LOISE Stalling, PA-C  07/22/2024 12:19 PM

## 2024-07-26 ENCOUNTER — Encounter (HOSPITAL_COMMUNITY)
Admission: RE | Disposition: A | Discharge status: Home / Self Care | Payer: Self-pay | Source: Home / Self Care | Attending: Orthopaedic Surgery

## 2024-07-26 ENCOUNTER — Encounter (HOSPITAL_COMMUNITY): Payer: Self-pay | Admitting: Orthopaedic Surgery

## 2024-07-26 ENCOUNTER — Inpatient Hospital Stay (HOSPITAL_COMMUNITY)
Admission: RE | Admit: 2024-07-26 | Discharge: 2024-07-27 | DRG: 483 | Disposition: A | Discharge status: Home / Self Care | Attending: Orthopaedic Surgery | Admitting: Orthopaedic Surgery

## 2024-07-26 ENCOUNTER — Inpatient Hospital Stay (HOSPITAL_COMMUNITY): Payer: Self-pay | Admitting: Anesthesiology

## 2024-07-26 ENCOUNTER — Inpatient Hospital Stay (HOSPITAL_COMMUNITY)

## 2024-07-26 ENCOUNTER — Other Ambulatory Visit: Payer: Self-pay

## 2024-07-26 DIAGNOSIS — G540 Brachial plexus disorders: Secondary | ICD-10-CM | POA: Diagnosis present

## 2024-07-26 DIAGNOSIS — Z87891 Personal history of nicotine dependence: Secondary | ICD-10-CM | POA: Diagnosis not present

## 2024-07-26 DIAGNOSIS — Y838 Other surgical procedures as the cause of abnormal reaction of the patient, or of later complication, without mention of misadventure at the time of the procedure: Secondary | ICD-10-CM | POA: Diagnosis present

## 2024-07-26 DIAGNOSIS — I2510 Atherosclerotic heart disease of native coronary artery without angina pectoris: Secondary | ICD-10-CM | POA: Diagnosis present

## 2024-07-26 DIAGNOSIS — J449 Chronic obstructive pulmonary disease, unspecified: Secondary | ICD-10-CM | POA: Diagnosis present

## 2024-07-26 DIAGNOSIS — Z96653 Presence of artificial knee joint, bilateral: Secondary | ICD-10-CM | POA: Diagnosis present

## 2024-07-26 DIAGNOSIS — M1990 Unspecified osteoarthritis, unspecified site: Secondary | ICD-10-CM | POA: Diagnosis present

## 2024-07-26 DIAGNOSIS — Z955 Presence of coronary angioplasty implant and graft: Secondary | ICD-10-CM

## 2024-07-26 DIAGNOSIS — I10 Essential (primary) hypertension: Secondary | ICD-10-CM | POA: Diagnosis present

## 2024-07-26 DIAGNOSIS — Z91030 Bee allergy status: Secondary | ICD-10-CM

## 2024-07-26 DIAGNOSIS — Z7982 Long term (current) use of aspirin: Secondary | ICD-10-CM | POA: Diagnosis not present

## 2024-07-26 DIAGNOSIS — Z8673 Personal history of transient ischemic attack (TIA), and cerebral infarction without residual deficits: Secondary | ICD-10-CM

## 2024-07-26 DIAGNOSIS — Z91013 Allergy to seafood: Secondary | ICD-10-CM

## 2024-07-26 DIAGNOSIS — Z96612 Presence of left artificial shoulder joint: Secondary | ICD-10-CM | POA: Diagnosis present

## 2024-07-26 DIAGNOSIS — Z7902 Long term (current) use of antithrombotics/antiplatelets: Secondary | ICD-10-CM

## 2024-07-26 DIAGNOSIS — I739 Peripheral vascular disease, unspecified: Secondary | ICD-10-CM | POA: Diagnosis present

## 2024-07-26 DIAGNOSIS — K219 Gastro-esophageal reflux disease without esophagitis: Secondary | ICD-10-CM | POA: Diagnosis present

## 2024-07-26 DIAGNOSIS — H9190 Unspecified hearing loss, unspecified ear: Secondary | ICD-10-CM | POA: Diagnosis present

## 2024-07-26 DIAGNOSIS — S43005A Unspecified dislocation of left shoulder joint, initial encounter: Secondary | ICD-10-CM | POA: Diagnosis present

## 2024-07-26 DIAGNOSIS — Z9109 Other allergy status, other than to drugs and biological substances: Secondary | ICD-10-CM

## 2024-07-26 DIAGNOSIS — Z8701 Personal history of pneumonia (recurrent): Secondary | ICD-10-CM | POA: Diagnosis not present

## 2024-07-26 DIAGNOSIS — T84028A Dislocation of other internal joint prosthesis, initial encounter: Principal | ICD-10-CM | POA: Diagnosis present

## 2024-07-26 DIAGNOSIS — Z87442 Personal history of urinary calculi: Secondary | ICD-10-CM | POA: Diagnosis not present

## 2024-07-26 DIAGNOSIS — T849XXA Unspecified complication of internal orthopedic prosthetic device, implant and graft, initial encounter: Secondary | ICD-10-CM | POA: Diagnosis not present

## 2024-07-26 DIAGNOSIS — Z8679 Personal history of other diseases of the circulatory system: Secondary | ICD-10-CM | POA: Diagnosis not present

## 2024-07-26 DIAGNOSIS — E785 Hyperlipidemia, unspecified: Secondary | ICD-10-CM | POA: Diagnosis present

## 2024-07-26 DIAGNOSIS — Y792 Prosthetic and other implants, materials and accessory orthopedic devices associated with adverse incidents: Secondary | ICD-10-CM | POA: Diagnosis present

## 2024-07-26 DIAGNOSIS — Z888 Allergy status to other drugs, medicaments and biological substances status: Secondary | ICD-10-CM | POA: Diagnosis not present

## 2024-07-26 DIAGNOSIS — Z79899 Other long term (current) drug therapy: Secondary | ICD-10-CM

## 2024-07-26 HISTORY — DX: Unspecified hearing loss, unspecified ear: H91.90

## 2024-07-26 HISTORY — DX: Chronic obstructive pulmonary disease, unspecified: J44.9

## 2024-07-26 HISTORY — PX: REVISION TOTAL SHOULDER TO REVERSE TOTAL SHOULDER: SHX6313

## 2024-07-26 HISTORY — DX: Aneurysm of iliac artery: I72.3

## 2024-07-26 HISTORY — DX: Hyperlipidemia, unspecified: E78.5

## 2024-07-26 HISTORY — DX: Radiculopathy, lumbar region: M54.16

## 2024-07-26 HISTORY — PX: OPEN TREATMENT, DISLOCATION, DISTAL RADIOULNAR JOINT: SHX7732

## 2024-07-26 HISTORY — PX: SYNOVECTOMY: SHX5180

## 2024-07-26 LAB — TYPE AND SCREEN
ABO/RH(D): A POS
Antibody Screen: NEGATIVE

## 2024-07-26 LAB — CBC
HCT: 34.2 % — ABNORMAL LOW (ref 39.0–52.0)
Hemoglobin: 11.1 g/dL — ABNORMAL LOW (ref 13.0–17.0)
MCH: 31.4 pg (ref 26.0–34.0)
MCHC: 32.5 g/dL (ref 30.0–36.0)
MCV: 96.9 fL (ref 80.0–100.0)
Platelets: 273 K/uL (ref 150–400)
RBC: 3.53 MIL/uL — ABNORMAL LOW (ref 4.22–5.81)
RDW: 12.2 % (ref 11.5–15.5)
WBC: 7.4 K/uL (ref 4.0–10.5)
nRBC: 0 % (ref 0.0–0.2)

## 2024-07-26 LAB — BASIC METABOLIC PANEL WITH GFR
Anion gap: 13 (ref 5–15)
BUN: 16 mg/dL (ref 8–23)
CO2: 21 mmol/L — ABNORMAL LOW (ref 22–32)
Calcium: 8.8 mg/dL — ABNORMAL LOW (ref 8.9–10.3)
Chloride: 102 mmol/L (ref 98–111)
Creatinine, Ser: 0.67 mg/dL (ref 0.61–1.24)
GFR, Estimated: >60 mL/min (ref >60)
Glucose, Bld: 128 mg/dL — ABNORMAL HIGH (ref 70–99)
Potassium: 3.8 mmol/L (ref 3.5–5.1)
Sodium: 136 mmol/L (ref 135–145)

## 2024-07-26 SURGERY — REVISION, REVERSE TOTAL ARTHROPLASTY, SHOULDER
Anesthesia: General | Site: Shoulder | Laterality: Left

## 2024-07-26 MED ORDER — CHLORHEXIDINE GLUCONATE 4 % EX SOLN: 1.0000 | CUTANEOUS | 1 refills | Status: DC

## 2024-07-26 MED ORDER — DOCUSATE SODIUM 100 MG PO CAPS
100.0000 mg | ORAL_CAPSULE | Freq: Two times a day (BID) | ORAL | Status: DC
  Administered 2024-07-26 – 2024-07-27 (×2): 100 mg via ORAL
  Filled 2024-07-26 (×2): qty 1

## 2024-07-26 MED ORDER — DEXAMETHASONE SODIUM PHOSPHATE 10 MG/ML IJ SOLN
INTRAMUSCULAR | Status: DC | PRN
  Administered 2024-07-26: 8 mg via INTRAVENOUS

## 2024-07-26 MED ORDER — FENTANYL CITRATE (PF) 100 MCG/2ML IJ SOLN
INTRAMUSCULAR | Status: DC | PRN
  Administered 2024-07-26 (×2): 50 ug via INTRAVENOUS

## 2024-07-26 MED ORDER — FENTANYL CITRATE PF 50 MCG/ML IJ SOSY
25.0000 ug | PREFILLED_SYRINGE | INTRAMUSCULAR | Status: DC | PRN
  Administered 2024-07-26 (×3): 50 ug via INTRAVENOUS

## 2024-07-26 MED ORDER — ONDANSETRON HCL 4 MG PO TABS: 4.0000 mg | ORAL_TABLET | Freq: Four times a day (QID) | ORAL | Status: DC | PRN

## 2024-07-26 MED ORDER — METOCLOPRAMIDE HCL 5 MG PO TABS: 5.0000 mg | ORAL_TABLET | Freq: Three times a day (TID) | ORAL | Status: DC | PRN

## 2024-07-26 MED ORDER — OXYCODONE HCL 5 MG PO TABS
10.0000 mg | ORAL_TABLET | ORAL | Status: DC | PRN
  Administered 2024-07-27: 10 mg via ORAL

## 2024-07-26 MED ORDER — METHOCARBAMOL 1000 MG/10ML IJ SOLN: 500.0000 mg | Freq: Four times a day (QID) | INTRAMUSCULAR | Status: DC | PRN

## 2024-07-26 MED ORDER — HYDROMORPHONE HCL 1 MG/ML IJ SOLN: 0.5000 mg | INTRAMUSCULAR | Status: DC | PRN

## 2024-07-26 MED ORDER — STERILE WATER FOR IRRIGATION IR SOLN
Status: DC | PRN
  Administered 2024-07-26: 2000 mL

## 2024-07-26 MED ORDER — BUPIVACAINE HCL (PF) 0.5 % IJ SOLN
INTRAMUSCULAR | Status: DC | PRN
  Administered 2024-07-26: 10 mL via PERINEURAL

## 2024-07-26 MED ORDER — POLYETHYLENE GLYCOL 3350 17 G PO PACK: 17.0000 g | PACK | Freq: Every day | ORAL | Status: DC | PRN

## 2024-07-26 MED ORDER — SUGAMMADEX SODIUM 200 MG/2ML IV SOLN
INTRAVENOUS | Status: AC
Start: 2024-07-26 — End: 2024-07-26
  Filled 2024-07-26: qty 2

## 2024-07-26 MED ORDER — BISACODYL 10 MG RE SUPP: 10.0000 mg | Freq: Every day | RECTAL | Status: DC | PRN

## 2024-07-26 MED ORDER — ACETAMINOPHEN 500 MG PO TABS
1000.0000 mg | ORAL_TABLET | Freq: Once | ORAL | Status: AC
  Administered 2024-07-26: 1000 mg via ORAL
  Filled 2024-07-26: qty 2

## 2024-07-26 MED ORDER — LIDOCAINE HCL (PF) 2 % IJ SOLN
INTRAMUSCULAR | Status: DC | PRN
  Administered 2024-07-26: 100 mg via INTRADERMAL

## 2024-07-26 MED ORDER — BUPIVACAINE LIPOSOME 1.3 % IJ SUSP
INTRAMUSCULAR | Status: DC | PRN
  Administered 2024-07-26: 10 mL via PERINEURAL

## 2024-07-26 MED ORDER — MIDAZOLAM HCL 2 MG/2ML IJ SOLN
INTRAMUSCULAR | Status: AC
  Administered 2024-07-26: 2 mg
  Filled 2024-07-26: qty 2

## 2024-07-26 MED ORDER — PANTOPRAZOLE SODIUM 40 MG PO TBEC
40.0000 mg | DELAYED_RELEASE_TABLET | Freq: Every day | ORAL | Status: DC
  Administered 2024-07-26 – 2024-07-27 (×2): 40 mg via ORAL
  Filled 2024-07-26 (×2): qty 1

## 2024-07-26 MED ORDER — GLYCOPYRROLATE 0.2 MG/ML IJ SOLN
INTRAMUSCULAR | Status: DC | PRN
  Administered 2024-07-26: .2 mg via INTRAVENOUS

## 2024-07-26 MED ORDER — PRONTOSAN WOUND IRRIGATION OPTIME
TOPICAL | Status: DC | PRN
  Administered 2024-07-26: 350 mL

## 2024-07-26 MED ORDER — PHENOL 1.4 % MT LIQD: 1.0000 | OROMUCOSAL | Status: DC | PRN

## 2024-07-26 MED ORDER — GLYCOPYRROLATE 0.2 MG/ML IJ SOLN
INTRAMUSCULAR | Status: AC
  Filled 2024-07-26: qty 1

## 2024-07-26 MED ORDER — ROCURONIUM BROMIDE 10 MG/ML (PF) SYRINGE
PREFILLED_SYRINGE | INTRAVENOUS | Status: DC | PRN
  Administered 2024-07-26: 60 mg via INTRAVENOUS

## 2024-07-26 MED ORDER — EZETIMIBE 10 MG PO TABS
10.0000 mg | ORAL_TABLET | Freq: Every evening | ORAL | Status: DC
  Administered 2024-07-26: 10 mg via ORAL
  Filled 2024-07-26: qty 1

## 2024-07-26 MED ORDER — OXYCODONE HCL 5 MG PO TABS: 5.0000 mg | ORAL_TABLET | Freq: Once | ORAL | Status: DC | PRN

## 2024-07-26 MED ORDER — METHOCARBAMOL 500 MG PO TABS: 500.0000 mg | ORAL_TABLET | Freq: Four times a day (QID) | ORAL | Status: DC | PRN

## 2024-07-26 MED ORDER — AMISULPRIDE (ANTIEMETIC) 5 MG/2ML IV SOLN: 10.0000 mg | Freq: Once | INTRAVENOUS | Status: DC | PRN

## 2024-07-26 MED ORDER — 0.9 % SODIUM CHLORIDE (POUR BTL) OPTIME
TOPICAL | Status: DC | PRN
  Administered 2024-07-26: 1000 mL

## 2024-07-26 MED ORDER — LIDOCAINE HCL (PF) 2 % IJ SOLN
INTRAMUSCULAR | Status: AC
  Filled 2024-07-26: qty 5

## 2024-07-26 MED ORDER — OXYCODONE HCL 5 MG/5ML PO SOLN: 5.0000 mg | Freq: Once | ORAL | Status: DC | PRN

## 2024-07-26 MED ORDER — ACETAMINOPHEN 500 MG PO TABS
1000.0000 mg | ORAL_TABLET | Freq: Four times a day (QID) | ORAL | Status: DC
  Administered 2024-07-26 – 2024-07-27 (×3): 1000 mg via ORAL
  Filled 2024-07-26 (×3): qty 2

## 2024-07-26 MED ORDER — ALFUZOSIN HCL ER 10 MG PO TB24
10.0000 mg | ORAL_TABLET | Freq: Every day | ORAL | Status: DC
  Administered 2024-07-27: 10 mg via ORAL
  Filled 2024-07-26: qty 1

## 2024-07-26 MED ORDER — ONDANSETRON HCL 4 MG/2ML IJ SOLN
INTRAMUSCULAR | Status: AC
  Filled 2024-07-26: qty 2

## 2024-07-26 MED ORDER — PHENYLEPHRINE 80 MCG/ML (10ML) SYRINGE FOR IV PUSH (FOR BLOOD PRESSURE SUPPORT)
PREFILLED_SYRINGE | INTRAVENOUS | Status: DC | PRN
  Administered 2024-07-26: 120 ug via INTRAVENOUS
  Administered 2024-07-26: 80 ug via INTRAVENOUS
  Administered 2024-07-26: 200 ug via INTRAVENOUS
  Administered 2024-07-26 (×2): 120 ug via INTRAVENOUS

## 2024-07-26 MED ORDER — MUPIROCIN 2 % EX OINT: 1.0000 | TOPICAL_OINTMENT | Freq: Two times a day (BID) | CUTANEOUS | 0 refills | Status: AC

## 2024-07-26 MED ORDER — CELECOXIB 200 MG PO CAPS
200.0000 mg | ORAL_CAPSULE | Freq: Two times a day (BID) | ORAL | Status: DC
  Administered 2024-07-26 – 2024-07-27 (×2): 200 mg via ORAL
  Filled 2024-07-26 (×2): qty 1

## 2024-07-26 MED ORDER — PHENYLEPHRINE HCL-NACL 20-0.9 MG/250ML-% IV SOLN
INTRAVENOUS | Status: DC | PRN
  Administered 2024-07-26: 20 ug/min via INTRAVENOUS

## 2024-07-26 MED ORDER — ORAL CARE MOUTH RINSE: 15.0000 mL | OROMUCOSAL | Status: DC | PRN

## 2024-07-26 MED ORDER — DIPHENHYDRAMINE HCL 12.5 MG/5ML PO ELIX: 12.5000 mg | ORAL_SOLUTION | ORAL | Status: DC | PRN

## 2024-07-26 MED ORDER — ONDANSETRON HCL 4 MG/2ML IJ SOLN: 4.0000 mg | Freq: Four times a day (QID) | INTRAMUSCULAR | Status: DC | PRN

## 2024-07-26 MED ORDER — FENTANYL CITRATE PF 50 MCG/ML IJ SOSY
PREFILLED_SYRINGE | INTRAMUSCULAR | Status: AC
  Filled 2024-07-26: qty 3

## 2024-07-26 MED ORDER — ONDANSETRON HCL 4 MG/2ML IJ SOLN
INTRAMUSCULAR | Status: DC | PRN
  Administered 2024-07-26: 4 mg via INTRAVENOUS

## 2024-07-26 MED ORDER — PROPOFOL 10 MG/ML IV BOLUS
INTRAVENOUS | Status: AC
  Filled 2024-07-26: qty 20

## 2024-07-26 MED ORDER — SODIUM CHLORIDE 0.9 % IR SOLN
Status: DC | PRN
  Administered 2024-07-26: 3000 mL

## 2024-07-26 MED ORDER — TRANEXAMIC ACID-NACL 1000-0.7 MG/100ML-% IV SOLN
1000.0000 mg | INTRAVENOUS | Status: AC
  Administered 2024-07-26: 1000 mg via INTRAVENOUS
  Filled 2024-07-26: qty 100

## 2024-07-26 MED ORDER — FINASTERIDE 5 MG PO TABS
5.0000 mg | ORAL_TABLET | Freq: Every day | ORAL | Status: DC
  Administered 2024-07-27: 5 mg via ORAL
  Filled 2024-07-26: qty 1

## 2024-07-26 MED ORDER — MENTHOL 3 MG MT LOZG: 1.0000 | LOZENGE | OROMUCOSAL | Status: DC | PRN

## 2024-07-26 MED ORDER — PROPOFOL 10 MG/ML IV BOLUS
INTRAVENOUS | Status: DC | PRN
  Administered 2024-07-26: 150 mg via INTRAVENOUS

## 2024-07-26 MED ORDER — ROCURONIUM BROMIDE 10 MG/ML (PF) SYRINGE
PREFILLED_SYRINGE | INTRAVENOUS | Status: AC
  Filled 2024-07-26: qty 10

## 2024-07-26 MED ORDER — DEXAMETHASONE SODIUM PHOSPHATE 10 MG/ML IJ SOLN
INTRAMUSCULAR | Status: AC
  Filled 2024-07-26: qty 1

## 2024-07-26 MED ORDER — CEFAZOLIN SODIUM-DEXTROSE 2-4 GM/100ML-% IV SOLN
2.0000 g | INTRAVENOUS | Status: AC
  Administered 2024-07-26: 2 g via INTRAVENOUS
  Filled 2024-07-26: qty 100

## 2024-07-26 MED ORDER — PHENYLEPHRINE 80 MCG/ML (10ML) SYRINGE FOR IV PUSH (FOR BLOOD PRESSURE SUPPORT)
PREFILLED_SYRINGE | INTRAVENOUS | Status: AC
Start: 2024-07-26 — End: 2024-07-26
  Filled 2024-07-26: qty 10

## 2024-07-26 MED ORDER — FENTANYL CITRATE PF 50 MCG/ML IJ SOSY
PREFILLED_SYRINGE | INTRAMUSCULAR | Status: AC
  Administered 2024-07-26: 50 ug via INTRAVENOUS
  Filled 2024-07-26: qty 1

## 2024-07-26 MED ORDER — VANCOMYCIN HCL 1000 MG IV SOLR
INTRAVENOUS | Status: DC | PRN
  Administered 2024-07-26: 1000 mg via TOPICAL

## 2024-07-26 MED ORDER — CEFAZOLIN SODIUM-DEXTROSE 2-4 GM/100ML-% IV SOLN
2.0000 g | Freq: Four times a day (QID) | INTRAVENOUS | Status: AC
  Administered 2024-07-26 – 2024-07-27 (×2): 2 g via INTRAVENOUS
  Filled 2024-07-26 (×2): qty 100

## 2024-07-26 MED ORDER — ORAL CARE MOUTH RINSE: 15.0000 mL | Freq: Once | OROMUCOSAL | Status: AC

## 2024-07-26 MED ORDER — LACTATED RINGERS IV SOLN: INTRAVENOUS | Status: DC

## 2024-07-26 MED ORDER — SUGAMMADEX SODIUM 200 MG/2ML IV SOLN
INTRAVENOUS | Status: DC | PRN
  Administered 2024-07-26: 200 mg via INTRAVENOUS

## 2024-07-26 MED ORDER — OXYCODONE HCL 5 MG PO TABS
5.0000 mg | ORAL_TABLET | ORAL | Status: DC | PRN
  Filled 2024-07-26: qty 2

## 2024-07-26 MED ORDER — CHLORHEXIDINE GLUCONATE 0.12 % MT SOLN
15.0000 mL | Freq: Once | OROMUCOSAL | Status: AC
  Administered 2024-07-26: 15 mL via OROMUCOSAL

## 2024-07-26 MED ORDER — VANCOMYCIN HCL 1000 MG IV SOLR
INTRAVENOUS | Status: AC
  Filled 2024-07-26: qty 20

## 2024-07-26 MED ORDER — MAGNESIUM CITRATE PO SOLN: 1.0000 | Freq: Once | ORAL | Status: DC | PRN

## 2024-07-26 MED ORDER — GABAPENTIN 300 MG PO CAPS
300.0000 mg | ORAL_CAPSULE | Freq: Once | ORAL | Status: AC
  Administered 2024-07-26: 300 mg via ORAL
  Filled 2024-07-26: qty 1

## 2024-07-26 MED ORDER — FENTANYL CITRATE (PF) 100 MCG/2ML IJ SOLN
INTRAMUSCULAR | Status: AC
  Filled 2024-07-26: qty 2

## 2024-07-26 MED ORDER — METOCLOPRAMIDE HCL 5 MG/ML IJ SOLN: 5.0000 mg | Freq: Three times a day (TID) | INTRAMUSCULAR | Status: DC | PRN

## 2024-07-26 SURGICAL SUPPLY — 71 items
ANCHOR SUT BIO SW 4.75X19.1 (Anchor) IMPLANT
ANCHOR SUT FBRTK 2.6X1.7X2 (Anchor) IMPLANT
BAG COUNTER SPONGE SURGICOUNT (BAG) ×1 IMPLANT
BLADE SAW SAG 29X58X.64 (BLADE) IMPLANT
BLADE SAW SGTL 73X25 THK (BLADE) IMPLANT
BLADE SAW SGTL 81X20 HD (BLADE) ×1 IMPLANT
BRUSH FEMORAL CANAL (MISCELLANEOUS) IMPLANT
CANISTER PREVENA PLUS 150 (CANNISTER) IMPLANT
CHLORAPREP W/TINT 26 (MISCELLANEOUS) ×2 IMPLANT
CNTNR URN SCR LID CUP LEK RST (MISCELLANEOUS) ×3 IMPLANT
COOLER ICEMAN CLASSIC (MISCELLANEOUS) ×1 IMPLANT
COVER BACK TABLE 60X90IN (DRAPES) IMPLANT
COVER SURGICAL LIGHT HANDLE (MISCELLANEOUS) ×1 IMPLANT
DRAPE C-ARM 42X120 X-RAY (DRAPES) IMPLANT
DRAPE INCISE IOBAN 66X45 STRL (DRAPES) ×1 IMPLANT
DRAPE POUCH INSTRU U-SHP 10X18 (DRAPES) ×1 IMPLANT
DRAPE SHEET LG 3/4 BI-LAMINATE (DRAPES) ×1 IMPLANT
DRAPE SURG ORHT 6 SPLT 77X108 (DRAPES) ×2 IMPLANT
DRAPE TOP 10253 STERILE (DRAPES) ×1 IMPLANT
DRESSING PEEL AND PLC PRVNA 13 (GAUZE/BANDAGES/DRESSINGS) IMPLANT
DRSG AQUACEL AG ADV 3.5X 6 (GAUZE/BANDAGES/DRESSINGS) ×1 IMPLANT
ELECT BLADE TIP CTD 4 INCH (ELECTRODE) ×1 IMPLANT
ELECT NDL TIP 2.8 STRL (NEEDLE) IMPLANT
ELECT NEEDLE TIP 2.8 STRL (NEEDLE) IMPLANT
ELECT PENCIL ROCKER SW 15FT (MISCELLANEOUS) ×1 IMPLANT
ELECT REM PT RETURN 15FT ADLT (MISCELLANEOUS) ×1 IMPLANT
EVACUATOR 1/8 PVC DRAIN (DRAIN) IMPLANT
FACESHIELD WRAPAROUND OR TEAM (MASK) ×3 IMPLANT
GAUZE SPONGE 4X4 12PLY STRL (GAUZE/BANDAGES/DRESSINGS) ×1 IMPLANT
GAUZE XEROFORM 1X8 LF (GAUZE/BANDAGES/DRESSINGS) ×1 IMPLANT
GLENOID STD CANN CC 42 (Shoulder) IMPLANT
GLOVE BIO SURGEON STRL SZ 6.5 (GLOVE) ×2 IMPLANT
GLOVE BIOGEL PI IND STRL 6.5 (GLOVE) ×1 IMPLANT
GLOVE BIOGEL PI IND STRL 8 (GLOVE) ×1 IMPLANT
GLOVE ECLIPSE 8.0 STRL XLNG CF (GLOVE) ×1 IMPLANT
GOWN STRL REUS W/ TWL LRG LVL3 (GOWN DISPOSABLE) ×1 IMPLANT
GOWN STRL REUS W/ TWL XL LVL3 (GOWN DISPOSABLE) ×1 IMPLANT
KIT BASIN OR (CUSTOM PROCEDURE TRAY) ×1 IMPLANT
KIT DRSG PREVENA PLUS 7DAY 125 (MISCELLANEOUS) IMPLANT
KIT STABILIZATION SHOULDER (MISCELLANEOUS) ×1 IMPLANT
KIT TURNOVER KIT A (KITS) ×1 IMPLANT
LINER HUM RSA +6 SZ3/4 42 10D (Liner) IMPLANT
MANIFOLD NEPTUNE II (INSTRUMENTS) ×1 IMPLANT
NDL 1/2 CIR CATGUT .05X1.09 (NEEDLE) IMPLANT
NEEDLE 1/2 CIR CATGUT .05X1.09 (NEEDLE) IMPLANT
NS IRRIG 1000ML POUR BTL (IV SOLUTION) ×1 IMPLANT
PACK SHOULDER (CUSTOM PROCEDURE TRAY) ×1 IMPLANT
PAD COLD SHLDR WRAP-ON (PAD) ×1 IMPLANT
RESTRAINT HEAD UNIVERSAL NS (MISCELLANEOUS) ×1 IMPLANT
RETRIEVER SUT HEWSON (MISCELLANEOUS) IMPLANT
SET HNDPC FAN SPRY TIP SCT (DISPOSABLE) ×1 IMPLANT
SLING ARM FOAM STRAP MED (SOFTGOODS) IMPLANT
SLING ARM FOAM STRAP XLG (SOFTGOODS) IMPLANT
SLING ARM IMMOBILIZER LRG (SOFTGOODS) IMPLANT
SLING ARM IMMOBILIZER MED (SOFTGOODS) IMPLANT
SOLUTION PRONTOSAN WOUND 350ML (IRRIGATION / IRRIGATOR) ×1 IMPLANT
SPACER PERFORM HUM SYS SZ 3/4 (Orthopedic Implant) IMPLANT
SPONGE T-LAP 4X18 ~~LOC~~+RFID (SPONGE) IMPLANT
SUPPORT WRAP ARM LG (MISCELLANEOUS) ×1 IMPLANT
SUT ETHIBOND 2 V 37 (SUTURE) ×1 IMPLANT
SUT ETHILON 2 0 PS N (SUTURE) IMPLANT
SUT ETHILON 3 0 FSL (SUTURE) ×1 IMPLANT
SUT MNCRL AB 4-0 PS2 18 (SUTURE) IMPLANT
SUT VIC AB 0 CT1 27XBRD ANBCTR (SUTURE) ×1 IMPLANT
SUT VIC AB 3-0 SH 27X BRD (SUTURE) ×1 IMPLANT
SUTURE FIBERWR #2 38 T-5 BLUE (SUTURE) IMPLANT
SUTURE FIBERWR #5 38 CONV NDL (SUTURE) IMPLANT
TOWEL OR 17X26 10 PK STRL BLUE (TOWEL DISPOSABLE) ×1 IMPLANT
TOWER SMARTMIX MINI (MISCELLANEOUS) IMPLANT
TUBE SUCTION HIGH CAP CLEAR NV (SUCTIONS) ×1 IMPLANT
WATER STERILE IRR 1000ML POUR (IV SOLUTION) ×1 IMPLANT

## 2024-07-26 NOTE — Anesthesia Procedure Notes (Signed)
 Anesthesia Regional Block: Interscalene brachial plexus block   Pre-Anesthetic Checklist: , timeout performed,  Correct Patient, Correct Site, Correct Laterality,  Correct Procedure, Correct Position, site marked,  Risks and benefits discussed,  Surgical consent,  Pre-op evaluation,  At surgeon's request and post-op pain management  Laterality: Left  Prep: chloraprep       Needles:  Injection technique: Single-shot  Needle Type: Echogenic Stimulator Needle     Needle Length: 9cm  Needle Gauge: 21     Additional Needles:   Procedures:,,,, ultrasound used (permanent image in chart),,    Narrative:  Start time: 07/26/2024 12:10 PM End time: 07/26/2024 12:15 PM Injection made incrementally with aspirations every 5 mL.  Performed by: Personally  Anesthesiologist: Peggye Delon Brunswick, MD  Additional Notes: Patient with pre-block numbness in all fingers of his hand and in his forearm beginning at his elbow and going down to the hand.  Discussed risks and benefits of nerve block including, but not limited to, prolonged and/or permanent nerve injury involving sensory and/or motor function. Monitors were applied and a time-out was performed. The nerve and associated structures were visualized under ultrasound guidance. After negative aspiration, local anesthetic was slowly injected around the nerve. There was no evidence of high pressure during the procedure. There were no paresthesias. VSS remained stable and the patient tolerated the procedure well.

## 2024-07-26 NOTE — Anesthesia Procedure Notes (Signed)
 Procedure Name: Intubation Date/Time: 07/26/2024 1:07 PM  Performed by: Augusta Daved SAILOR, CRNAPre-anesthesia Checklist: Patient identified, Emergency Drugs available, Suction available and Patient being monitored Patient Re-evaluated:Patient Re-evaluated prior to induction Oxygen Delivery Method: Circle System Utilized Preoxygenation: Pre-oxygenation with 100% oxygen Induction Type: IV induction Ventilation: Mask ventilation without difficulty and Oral airway inserted - appropriate to patient size Laryngoscope Size: Glidescope and 3 Grade View: Grade I Tube type: Oral Number of attempts: 1 Airway Equipment and Method: Stylet and Oral airway Placement Confirmation: ETT inserted through vocal cords under direct vision, positive ETCO2 and breath sounds checked- equal and bilateral Secured at: 21.5 (at the teeth) cm Tube secured with: Tape Dental Injury: Teeth and Oropharynx as per pre-operative assessment

## 2024-07-26 NOTE — Op Note (Signed)
 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  Patient's history is well-documented previous notes.  He had had a failed reverse shoulder arthroplasty by another surgeon with atypical baseplate position and a history of instability.  Unfortunately an instability event after his revision.  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  Unfortunately was not able to take him to the operating room quickly as he had anticoagulants had to clear his system.  Upon examination of the joint his initial tension was reasonable once open reduced.  That said he had no anterior capsular restraint to anterior subluxation.  We upsized his glenosphere to a 42 and upsized his polyethylene to a 10 degree liner +6.  He is standard stem 3 long and 25+3 baseplate were retained.  We performed a posterior synovectomy and release of the posterior inferior capsule.  We attempted to pass sutures using a technique as was described by Charolotte dunker al but due to the patient's body habitus it was difficult to make an appropriate past with a 2 mm K wire posterior to anterior without putting at risk as axillary artery.  We instead used 2-6 fiber tack anchors loaded with fiber tape and brought these to two 4.75 swivel locks anterior and anterior superior on the humeral side.  Without anterior restraint the patient still at high risk of recurrent instability.  I think likely if he fails this he will likely have to be transition to a hemiarthroplasty with bone grafting and come back for a second stage surgery with implantation.  By overtensioning  to try and achieve  more tension, we will likely destroy his scapular ORIF thus that is not a reasonable plan.  Post-operative plan: The patient will be NWB in sling.  The patient will be will be admitted to observation due to medical complexity, monitoring and pain management.drain removed in the morning and Prevena dressing used at home.  DVT prophylaxis not indicated as patient already on full dose anticoagulant.  Pain control with PRN pain medication preferring oral medicines.  Follow up plan will be scheduled in approximately 7 days for incision check and XR.  Physical therapy to start after 4 weeks to 6 weeks depending on patient compliance.  Implants: Tornier perform humeral size 3 long stem, +6 spacer, +6 10 degree liner, 42 standard glenosphere  Post-Op Diagnosis: Same Surgeons:Primary: Cristy Bonner DASEN, MD Assistants:Caroline McBane, PA-C Location: [facility] ROOM 07 Anesthesia: General with Exparel  Interscalene Antibiotics: Ancef  2g preop, Vancomycin  1000mg  locally Tourniquet time: None Estimated Blood Loss: 150 Complications: None Specimens: None Implants: Implant Name Type Inv. Item Serial No. Manufacturer Lot No. LRB No. Used Action  ANCHOR SUT FBRTK 2.6X1.7X2 - [id number] Anchor ANCHOR SUT FBRTK 2.6X1.7X2  [employer] [id number] Left 1 Implanted  ANCHOR SUT FBRTK 2.6X1.7X2 - [id number] Anchor ANCHOR SUT FBRTK 2.6X1.7X2  [employer] [id number] Left 1 Implanted  GLENOID STD CANN CC 42 - [id number] Shoulder GLENOID STD CANN CC 42 [id number] [employer]  Left 1 Implanted  SPACER PERFORM HUM SYS SZ 3/4 - [id number] Orthopedic Implant SPACER PERFORM HUM SYS SZ 3/4 [id number] [employer]  Left 1 Implanted  10 DEGREE REVERSED INSERT , THICKNESS PLUS UHMWPE + [id number] Shoulder  [id number] [employer]  Left 1 Implanted  ANCHOR SUT BIO SW 4.75X19.1 - [id number] Anchor ANCHOR SUT BIO SW 4.75X19.1  [employer] [id number] Left 1 Implanted  ANCHOR SUT BIO SW 4.75X19.1 - [id number] Anchor ANCHOR SUT BIO SW  4.75X19.1  [employer] [id number] Left 1 Implanted    Indications for Surgery:   Roy Caldwell is a 68 y.o. male with complex history described in previous notes with new shoulder instability.  Benefits and risks of operative and nonoperative management were discussed prior to surgery with patient/guardian(s) and informed consent form was completed.  Infection and need for further surgery were discussed as was prosthetic stability and cuff issues.  We additionally specifically discussed risks of axillary nerve injury, infection, periprosthetic fracture, continued pain and longevity of implants prior to beginning procedure.      Procedure:   The patient was identified in the preoperative holding area where the surgical site was marked. Block placed by anesthesia with exparel .  The patient was taken to the OR where a procedural timeout was called and the above noted anesthesia was induced.  The patient was positioned beachchair on allen table with spider arm positioner.  Preoperative antibiotics were dosed.  The patient's left shoulder was prepped and draped in the usual sterile fashion.  A second preoperative timeout was called.       We began by using the patient's previous deltopectoral incision.  Went through skin sharply to hemostasis of progress.  We identified the deltopectoral interval and bluntly dissected.  We immediately encountered seroma consistent with his dislocation.  At that point I was able to open and treat his dislocation and reduced the joint.  The overall tension was reasonable however the posterior capsule appeared to be tight as he had been dislocated for a number of days.  I was able to read dislocate the joint.  We removed our implants on the humeral side.  We irrigated copiously and performed a synovectomy the anterior capsule and released the anterior-inferior capsule as well as the upper half of the pectoralis.  We then turned attention at the glenoid.  I remove the  glenosphere.  We irrigated 2 L of normal saline around the glenoid.  Prontosan solution was used as instructed.  This point identified the posterior capsule.  Blunt retractors placed.  We performed a synovectomy and capsular resection posteriorly.  Posterior inferior was particularly identified as an area that would be beneficial to remove.  Once we had completed our 360 degree release in the glenoid we started a plan for a suture based configuration to aid in anterior capsule reconstruction.  Initially I tried to pass bone tunnels medial to the glenoid however due to the patient's previous surgery there was very little bone stock for this.  I instead placed two 2.6 fiber tack anchors in the anterior inferior and anterior superior aspect of the glenoid just medial to the baseplate.  We have good purchase with these anchors.  At that point I placed a 42 mm standard glenosphere.  We then reexposed the humerus and trialed settling on a 10 degree +6 liner.  This was combined with a +6 polyethylene.  Final construct was stable.  We reduced the final construct and checked that our stability was reasonable.  We then placed two 4.75 peek swivel locks in the anterior inferior lesser tuberosity and anterior inferior greater tuberosity with reasonable purchase.  This constituted a capsular augment and repair though there is not a particular code for this exact procedure.  This provided anterior straight from the  implants leaking anteriorly.  The arm was in scaption and about 30 degrees of forward flexion.  We irrigated with 3 L normal saline and placed Vicryl sutures in the deltoid pectoral interval.  We placed a deep medium Hemovac drain.  Incision was closed with nylon sutures and a Prevena dressing was placed.  Sling was placed and patient awoken taken to PACU in stable condition.   Aleck Stalling, PA-C, present and scrubbed throughout the case, critical for completion in a timely fashion, and for retraction,  instrumentation, closure.

## 2024-07-26 NOTE — Anesthesia Postprocedure Evaluation (Signed)
 Anesthesia Post Note  Patient: Roy Caldwell  Procedure(s) Performed: REVISION, REVERSE TOTAL ARTHROPLASTY, SHOULDER (Left: Shoulder) SYNOVECTOMY (Left: Shoulder) OPEN TREATMENT, DISLOCATION (Left: Shoulder)     Patient location during evaluation: PACU Anesthesia Type: General Level of consciousness: awake Pain management: pain level controlled Vital Signs Assessment: post-procedure vital signs reviewed and stable Respiratory status: spontaneous breathing, nonlabored ventilation and respiratory function stable Cardiovascular status: blood pressure returned to baseline and stable Postop Assessment: no apparent nausea or vomiting Anesthetic complications: no   No notable events documented.                  Delon Aisha Arch

## 2024-07-26 NOTE — Transfer of Care (Signed)
 Immediate Anesthesia Transfer of Care Note  Patient: Roy Caldwell [facility]  Procedure(s) Performed: REVISION, REVERSE TOTAL ARTHROPLASTY, SHOULDER (Left: Shoulder) SYNOVECTOMY (Left: Shoulder) OPEN TREATMENT, DISLOCATION (Left: Shoulder)  Patient Location: PACU  Anesthesia Type:General and Regional  Level of Consciousness: awake, alert , and oriented  Airway & Oxygen Therapy: Patient Spontanous Breathing and Patient connected to face mask oxygen  Post-op Assessment: Report given to RN and Post -op Vital signs reviewed and stable  Post vital signs: Reviewed and stable  Last Vitals:  Vitals Value Taken Time  BP 139/77 07/26/24 15:17  Temp    Pulse 56 07/26/24 15:20  Resp 13 07/26/24 15:20  SpO2 100 % 07/26/24 15:20  Vitals shown include unfiled device data.  Last Pain:  Vitals:   07/26/24 1225  TempSrc:   PainSc: 0-No pain      Patients Stated Pain Goal: 3 (07/21/24 1147)  Complications: No notable events documented.

## 2024-07-26 NOTE — Anesthesia Preprocedure Evaluation (Addendum)
 Anesthesia Evaluation  Patient identified by MRN, date of birth, ID band Patient awake    Reviewed: Allergy & Precautions, NPO status , Patient's Chart, lab work & pertinent test results  History of Anesthesia Complications Negative for: history of anesthetic complications  Airway Mallampati: III  TM Distance: >3 FB Neck ROM: Full   Comment: Previous grade I view with Glidescope, easy mask Dental  (+) Dental Advisory Given   Pulmonary COPD (does not use inhalers), former smoker   Pulmonary exam normal breath sounds clear to auscultation       Cardiovascular hypertension, (-) angina + CAD, + Cardiac Stents (DES LAD, RCA 05/25/2021) and + Peripheral Vascular Disease  (-) CABG  Rhythm:Regular Rate:Normal  Left common iliac artery aneurysm, HLD  Stress Test 03/24/2024: Impression  1.  Small apical/apicoseptal infarct.  No ischemia..  2. Normal left ventricular wall motion.  3. Left ventricular ejection fraction 76%  4. Non invasive risk stratification*: Low  LHC 05/25/2021: IMP:  Double vessel CAD:  Severe prox LAD stenosis;  CTO of RCA with  inferobasal akinesis.   PCI:  DES LAD, successful. Sp1 pinched however maintained good flow by end  of procedure   PLAN: DAPT x 6-12 months.        Neuro/Psych neg Seizures TIA (2021) Neuromuscular disease (lumbar radiculopathy)    GI/Hepatic Neg liver ROS,GERD  Medicated,,  Endo/Other  negative endocrine ROS    Renal/GU CRFRenal disease     Musculoskeletal  (+) Arthritis ,    Abdominal  (+) + obese  Peds  Hematology  (+) Blood dyscrasia, anemia Lab Results      Component                Value               Date                      WBC                      7.3                 07/02/2024                HGB                      12.7 (L)            07/02/2024                HCT                      39.8                07/02/2024                MCV                       98.5                07/02/2024                PLT                      251                 07/02/2024  Anesthesia Other Findings Last Plavix: 07/19/2024  Reproductive/Obstetrics                              Anesthesia Physical Anesthesia Plan  ASA: 3  Anesthesia Plan: General   Post-op Pain Management: Regional block* and Tylenol  PO (pre-op)*   Induction: Intravenous  PONV Risk Score and Plan: 2 and Ondansetron , Dexamethasone , Treatment may vary due to age or medical condition and Midazolam   Airway Management Planned: Oral ETT and Video Laryngoscope Planned  Additional Equipment:   Intra-op Plan:   Post-operative Plan: Extubation in OR  Informed Consent: I have reviewed the patients History and Physical, chart, labs and discussed the procedure including the risks, benefits and alternatives for the proposed anesthesia with the patient or authorized representative who has indicated his/her understanding and acceptance.     Dental advisory given  Plan Discussed with: CRNA and Anesthesiologist  Anesthesia Plan Comments: (Discussed potential risks of nerve blocks including, but not limited to, infection, bleeding, nerve damage, seizures, pneumothorax, respiratory depression, and potential failure of the block. Alternatives to nerve blocks discussed. All questions answered.  Risks of general anesthesia discussed including, but not limited to, sore throat, hoarse voice, chipped/damaged teeth, injury to vocal cords, nausea and vomiting, allergic reactions, lung infection, heart attack, stroke, and death. All questions answered. )         Anesthesia Quick Evaluation

## 2024-07-26 NOTE — Interval H&P Note (Signed)
 All questions answered. This is a complex surgery even in the revision setting. Perioperative complications after revision shoulders are quoted at at least 30%. Unfortunately the patient had a dislocation. We were attempting to avoid overstressing his fracture in the scapula. At this point another revision is indicated.

## 2024-07-26 NOTE — Plan of Care (Signed)
  Problem: Education: Goal: Knowledge of General Education information will improve Description: Including pain rating scale, medication(s)/side effects and non-pharmacologic comfort measures Outcome: Progressing   Problem: Health Behavior/Discharge Planning: Goal: Ability to manage health-related needs will improve Outcome: Progressing   Problem: Clinical Measurements: Goal: Ability to maintain clinical measurements within normal limits will improve Outcome: Progressing Goal: Will remain free from infection Outcome: Progressing Goal: Diagnostic test results will improve Outcome: Progressing Goal: Respiratory complications will improve Outcome: Progressing Goal: Cardiovascular complication will be avoided Outcome: Progressing   Problem: Activity: Goal: Risk for activity intolerance will decrease Outcome: Adequate for Discharge   Problem: Nutrition: Goal: Adequate nutrition will be maintained Outcome: Completed/Met   Problem: Coping: Goal: Level of anxiety will decrease Outcome: Progressing   Problem: Elimination: Goal: Will not experience complications related to bowel motility Outcome: Progressing Goal: Will not experience complications related to urinary retention Outcome: Completed/Met   Problem: Pain Managment: Goal: General experience of comfort will improve and/or be controlled Outcome: Progressing   Problem: Safety: Goal: Ability to remain free from injury will improve Outcome: Progressing   Problem: Skin Integrity: Goal: Risk for impaired skin integrity will decrease Outcome: Progressing   Problem: Education: Goal: Knowledge of the prescribed therapeutic regimen will improve Outcome: Adequate for Discharge Goal: Understanding of activity limitations/precautions following surgery will improve Outcome: Progressing Goal: Individualized Educational Video(s) Outcome: Completed/Met   Problem: Activity: Goal: Ability to tolerate increased activity will  improve Outcome: Progressing   Problem: Pain Management: Goal: Pain level will decrease with appropriate interventions Outcome: Progressing

## 2024-07-27 ENCOUNTER — Other Ambulatory Visit (HOSPITAL_COMMUNITY): Payer: Self-pay | Admitting: Physician Assistant

## 2024-07-27 DIAGNOSIS — Z96612 Presence of left artificial shoulder joint: Secondary | ICD-10-CM

## 2024-07-27 MED ORDER — METHOCARBAMOL 500 MG PO TABS: 500.0000 mg | ORAL_TABLET | Freq: Three times a day (TID) | ORAL | 0 refills | Status: DC | PRN

## 2024-07-27 MED ORDER — CEFADROXIL 500 MG PO CAPS: 500.0000 mg | ORAL_CAPSULE | Freq: Two times a day (BID) | ORAL | 0 refills | Status: DC

## 2024-07-27 MED ORDER — ONDANSETRON HCL 4 MG PO TABS: 4.0000 mg | ORAL_TABLET | Freq: Three times a day (TID) | ORAL | 0 refills | Status: DC | PRN

## 2024-07-27 MED ORDER — ACETAMINOPHEN 500 MG PO TABS: 1000.0000 mg | ORAL_TABLET | Freq: Three times a day (TID) | ORAL | 0 refills | Status: AC

## 2024-07-27 MED ORDER — ACETAMINOPHEN 500 MG PO TABS: 1000.0000 mg | ORAL_TABLET | Freq: Three times a day (TID) | ORAL | 0 refills | Status: DC

## 2024-07-27 MED ORDER — DICLOFENAC SODIUM 75 MG PO TBEC: 75.0000 mg | DELAYED_RELEASE_TABLET | Freq: Two times a day (BID) | ORAL | 0 refills | Status: AC

## 2024-07-27 MED ORDER — ONDANSETRON HCL 4 MG PO TABS: 4.0000 mg | ORAL_TABLET | Freq: Three times a day (TID) | ORAL | 0 refills | Status: AC | PRN

## 2024-07-27 MED ORDER — CEFADROXIL 500 MG PO CAPS: 500.0000 mg | ORAL_CAPSULE | Freq: Two times a day (BID) | ORAL | 0 refills | Status: AC

## 2024-07-27 NOTE — Progress Notes (Signed)
   07/27/24 1107  TOC Brief Assessment  Insurance and Status Reviewed  Patient has primary care physician Yes  Home environment has been reviewed home with spouse  Prior level of function: independent  Prior/Current Home Services No current home services  Social Drivers of Health Review SDOH reviewed no interventions necessary  Readmission risk has been reviewed Yes  Transition of care needs no transition of care needs at this time

## 2024-07-27 NOTE — Progress Notes (Signed)
 Patient's prescriptions were resent to the pharmacy.   Flavio Lindroth, PA-C

## 2024-07-27 NOTE — Progress Notes (Signed)
 Post op medications resent to pharmacy   Aleck Stalling, PA-C

## 2024-07-27 NOTE — Discharge Instructions (Signed)
 Bonner Hair MD, MPH Aleck Stalling, PA-C [facility] [address] [phone] (tel)   [phone] (fax)   POST-OPERATIVE INSTRUCTIONS - TOTAL SHOULDER REPLACEMENT    WOUND CARE You may leave the operative dressing in place until your follow-up appointment. KEEP THE INCISIONS CLEAN AND DRY. There may be a small amount of fluid/bleeding leaking at the surgical site. This is normal after surgery.  If it fills with liquid or blood please call us  immediately to change it for you. Use the provided ice machine or Ice packs as often as possible for the first 3-4 days, then as needed for pain relief.   Keep a layer of cloth or a shirt between your skin and the cooling unit to prevent frost bite as it can get very cold.  SHOWERING: - Bird baths only - DO NOT REMOVE YOUR SLING - KEEP SHOULDER CLEAN AND DRY  EXERCISES Wear the sling at all times   It is normal for your fingers/hand to become more swollen after surgery and discolored from bruising.   This will resolve over the first few weeks usually after surgery. Please continue to ambulate and do not stay sitting or lying for too long.  Perform foot and wrist pumps to assist in circulation.  PHYSICAL THERAPY - NO THERAPY FOR 4-6 WEEKS  REGIONAL ANESTHESIA (NERVE BLOCKS) The anesthesia team may have performed a nerve block for you this is a great tool used to minimize pain.   The block may start wearing off overnight (between 8-24 hours postop) When the block wears off, your pain may go from nearly zero to the pain you would have had postop without the block. This is an abrupt transition but nothing dangerous is happening.   This can be a challenging period but utilize your as needed pain medications to try and manage this period. We suggest you use the pain medication the first night prior to going to bed, to ease this transition.  You may take an extra dose of narcotic when this happens if  needed   POST-OP MEDICATIONS- Multimodal approach to pain control In general your pain will be controlled with a combination of substances.  Prescriptions unless otherwise discussed are electronically sent to your pharmacy.  This is a carefully made plan we use to minimize narcotic use.      Acetaminophen  - Non-narcotic pain medicine taken on a scheduled basis  Oxycodone  - This is a strong narcotic, to be used only on an "as needed" basis for SEVERE pain. Continue as prescribed by your pain management provider Zofran  -  take as needed for nausea Cefadroxil  - this is an antibiotic, take as instructed  FOLLOW-UP If you develop a Fever (>101.5), Redness or Drainage from the surgical incision site, please call our office to arrange for an evaluation. Please call the office to schedule a follow-up appointment for a wound check, 7-10 days post-operatively.  IF YOU HAVE ANY QUESTIONS, PLEASE FEEL FREE TO CALL OUR OFFICE.  HELPFUL INFORMATION  Your arm will be in a sling following surgery. You will be in this sling for the next 4-6 weeks.   You may be more comfortable sleeping in a semi-seated position the first few nights following surgery.  Keep a pillow propped under the elbow and forearm for comfort.  If you have a recliner type of chair it might be beneficial.  If not that is fine too, but it would be helpful to sleep propped up with pillows behind your  operated shoulder as well under your elbow and forearm.  This will reduce pulling on the suture lines.  Do not remove your arm from your sling to dress. You should put your shirt over your arm and sling  In most states it is against the law to drive while your arm is in a sling. And certainly against the law to drive while taking narcotics.  You may return to work/school in the next couple of days when you feel up to it. Desk work and typing in the sling is fine.  We suggest you use the pain medication the first night prior to going to  bed, in order to ease any pain when the anesthesia wears off. You should avoid taking pain medications on an empty stomach as it will make you nauseous.  You should wean off your narcotic medicines as soon as you are able.     Most patients will be off narcotics before their first postop appointment.   Do not drink alcoholic beverages or take illicit drugs when taking pain medications.  Pain medication may make you constipated.  Below are a few solutions to try in this order: Decrease the amount of pain medication if you aren't having pain. Drink lots of decaffeinated fluids. Drink prune juice and/or each dried prunes  If the first 3 don't work start with additional solutions Take Colace - an over-the-counter stool softener Take Senokot - an over-the-counter laxative Take Miralax  - a stronger over-the-counter laxative   Dental Antibiotics:  We require dental prophylaxis for 2 years after a shoulder replacement  Contact your surgeon for an antibiotic prescription, prior to your dental procedure.   For more information including helpful videos and documents visit our website:   [email or web address]

## 2024-07-27 NOTE — Progress Notes (Signed)
   ORTHOPAEDIC PROGRESS NOTE  s/p Procedure(s): REVISION, REVERSE TOTAL ARTHROPLASTY, SHOULDER SYNOVECTOMY OPEN TREATMENT, DISLOCATION  SUBJECTIVE: Reports mild pain about operative site. Arm still feels numb  No chest pain. No SOB. No nausea/vomiting. No other complaints.  OBJECTIVE: PE: General: sitting up in hospital bed, NAD LUE: prevena wound vac in place with good seal, no drainage noted in canister, hemovac in place, approximately 20 cc of bloody drainage noted in canister. Axillary nerve sensation/motor altered in setting of block and unable to be fully tested.  Distal sensory altered in setting of block.Motor in  AIN and PIN distributions. Difficulty with ulnar nerve motor function.Well perfused digits.    Vitals:   07/27/24 0133 07/27/24 0610  BP: (!) 146/71 139/70  Pulse: 68 69  Resp: 18 18  Temp: 97.9 F (36.6 C) 97.8 F (36.6 C)  SpO2: 99% 99%    Opiates Today (MME): Today's  total administered Morphine Milligram Equivalents: 0 Opiates Yesterday (MME): Yesterday's total administered Morphine Milligram Equivalents: 90 Opiates Used in the last two days:  Inpatient Morphine Milligram Equivalents Per Day 9/15 - 9/16   Values displayed are in units of MME/Day    Order Start / End Date Yesterday Today    oxyCODONE  (Oxy IR/ROXICODONE ) immediate release tablet 5 mg 9/15 - 9/15 0 of Unknown --    oxyCODONE  (ROXICODONE ) 5 MG/5ML solution 5 mg 9/15 - 9/15 0 of Unknown --      Group total: 0 of Unknown     fentaNYL  (SUBLIMAZE ) injection 25-50 mcg 9/15 - 9/15 60 of 45-90 --    fentaNYL  (SUBLIMAZE ) 50 MCG/ML injection 9/15 - 9/15 0 of Unknown --    fentaNYL  (SUBLIMAZE ) injection 9/15 - 9/15 *30 of 30 --    HYDROmorphone  (DILAUDID ) injection 0.5-1 mg 9/15 - No end date 0 of 20-40 0 of 60-120    oxyCODONE  (Oxy IR/ROXICODONE ) immediate release tablet 5-10 mg 9/15 - No end date 0 of 15-30 0 of 45-90    oxyCODONE  (Oxy IR/ROXICODONE ) immediate release tablet 10-15 mg 9/15 - No  end date 0 of 30-45 0 of 90-135    Daily Totals  * 90 of Unknown (at least 140-235) 0 of 195-345  *One-Step medication  Calculation Errors     Order Type Date Details   oxyCODONE  (Oxy IR/ROXICODONE ) immediate release tablet 5 mg Ordered Dose -- Insufficient frequency information   oxyCODONE  (ROXICODONE ) 5 MG/5ML solution 5 mg Ordered Dose -- Insufficient frequency information   fentaNYL  (SUBLIMAZE ) 50 MCG/ML injection Ordered Dose -- Frequency type could not be determined           Stable post-op images  ASSESSMENT: Wlliam Grosso is a 68 y.o. male POD#1  PLAN: Weightbearing: NWB LUE Insicional and dressing care: Dressings left intact until follow-up Orthopedic device(s): Wound Vac:no drainage, Hemovac: 20 cc, Sling Showering: Avoid for now VTE prophylaxis: Resume home meds Pain control: PRN pain medications, minimize narcotics as able Follow - up plan: Thursday in office Dispo: Plan to discharge home today with wife. Patient and his wife (spoke to wife via telephone this AM) feel comfortable with the patient discharging home today with close follow-up.  Contact information:  Weekdays 8-5 Dr. Bonner Hair, Aleck Stalling PA-C, After hours and holidays please check [email or web address] for group call information for [facility]  Aleck Stalling, PA-C 07/27/24

## 2024-07-28 ENCOUNTER — Encounter (HOSPITAL_COMMUNITY): Payer: Self-pay | Admitting: Orthopaedic Surgery

## 2024-07-28 NOTE — Discharge Summary (Signed)
 Patient ID: Roy Caldwell MRN: [id number] DOB/AGE: 1956-09-12 68 y.o.  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)    Arthritis    CAD (coronary artery disease)    Cancer (HCC)    basal cell on scalp   Chronic kidney disease    COPD (chronic obstructive pulmonary disease) (HCC)    GERD (gastroesophageal reflux disease)    Hearing loss    History of kidney stones    HLD (hyperlipidemia)    Hypertension    Lumbar radiculopathy    Neuromuscular disorder (HCC)    radiculopathy, 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  Tolerated procedure well.  Was kept for monitoring overnight for pain control and medical monitoring postop and was found to be stable for DC home the morning after surgery.  Patient was instructed on specific activity restrictions and all questions were answered.   Opiates Today (MME): Today's  total administered Morphine Milligram Equivalents: 0 Opiates Yesterday (MME): Yesterday's total administered Morphine Milligram Equivalents: 15 Opiates Used in the last two days:  Inpatient Morphine Milligram Equivalents Per Day 9/15 - 9/16   Values displayed are in units of MME/Day    Order Start / End Date 9/15 Yesterday    oxyCODONE  (Oxy IR/ROXICODONE ) immediate release tablet 5 mg 9/15 - 9/15 0 of Unknown --    oxyCODONE  (ROXICODONE ) 5 MG/5ML solution 5  mg 9/15 - 9/15 0 of Unknown --      Group total: 0 of Unknown     fentaNYL  (SUBLIMAZE ) injection 25-50 mcg 9/15 - 9/15 60 of 45-90 --    fentaNYL  (SUBLIMAZE ) 50 MCG/ML injection 9/15 - 9/15 0 of Unknown --    fentaNYL  (SUBLIMAZE ) injection 9/15 - 9/15 *30 of 30 --    HYDROmorphone  (DILAUDID ) injection 0.5-1 mg 9/15 - 9/16 0 of 20-40 0 of 40-80    oxyCODONE  (Oxy IR/ROXICODONE ) immediate release tablet 5-10 mg 9/15 - 9/16 0 of 15-30 0 of 30-60    oxyCODONE  (Oxy IR/ROXICODONE ) immediate release tablet 10-15 mg 9/15 - 9/16 0 of 30-45 15 of 60-90    Daily Totals  * 90 of Unknown (at least 140-235) 15 of 130-230  *One-Step medication  Calculation Errors     Order Type Date Details   oxyCODONE  (Oxy IR/ROXICODONE ) immediate release tablet 5 mg Ordered Dose -- Insufficient frequency information   oxyCODONE  (ROXICODONE ) 5 MG/5ML solution 5 mg Ordered Dose -- Insufficient frequency information   fentaNYL  (SUBLIMAZE ) 50 MCG/ML injection Ordered Dose -- Frequency type could not be determined            Consults: None  Significant Diagnostic Studies: No additional pertinent studies  Treatments: Surgery  Discharge Exam: General: sitting up in hospital bed, NAD LUE: prevena wound vac in place with good seal, no drainage noted in canister, hemovac in place, approximately 20 cc of bloody drainage noted in canister. Axillary nerve sensation/motor altered in setting of block and unable to be  fully tested.  Distal sensory altered in setting of block.Motor in  AIN and PIN distributions. Difficulty with ulnar nerve motor function.Well perfused digits  Disposition: Discharge disposition: 01-Home or Self Care       Discharge Instructions     Call MD for:  redness, tenderness, or signs of infection (pain, swelling, redness, odor or green/yellow discharge around incision site)   Complete by: As directed    Call MD for:  severe uncontrolled pain   Complete by: As directed    Call MD for:   temperature >100.4   Complete by: As directed    Diet - low sodium heart healthy   Complete by: As directed       Allergies as of 07/27/2024       Reactions   Bee Venom Anaphylaxis, Hives   Shellfish Allergy Anaphylaxis, Hives, Dermatitis   Pt can tolerate shrimp   Statins    Tape Itching        Medication List     TAKE these medications    acetaminophen  500 MG tablet Commonly known as: TYLENOL  Take 2 tablets (1,000 mg total) by mouth every 8 (eight) hours for 14 days. What changed:  how much to take when to take this reasons to take this additional instructions   alfuzosin  10 MG 24 hr tablet Commonly known as: UROXATRAL  Take 10 mg by mouth daily.   aspirin  EC 81 MG tablet Take 81 mg by mouth daily. Swallow whole.   chlorhexidine  4 % external liquid Commonly known as: HIBICLENS  Apply 15 mLs (1 Application total) topically as directed for 30 doses. Use as directed daily for 5 days every other week for 6 weeks. What changed: Another medication with the same name was added. Make sure you understand how and when to take each.   chlorhexidine  4 % external liquid Commonly known as: HIBICLENS  Apply 15 mLs (1 Application total) topically as directed for 30 doses. Use as directed daily for 5 days every other week for 6 weeks. What changed: You were already taking a medication with the same name, and this prescription was added. Make sure you understand how and when to take each.   cholecalciferol 25 MCG (1000 UNIT) tablet Commonly known as: VITAMIN D3 Take 1,000 Units by mouth daily.   cilostazol 50 MG tablet Commonly known as: PLETAL Take 50 mg by mouth 2 (two) times daily.   clopidogrel 75 MG tablet Commonly known as: PLAVIX Take 75 mg by mouth daily.   cyanocobalamin 500 MCG tablet Commonly known as: VITAMIN B12 Take 500 mcg by mouth daily.   ezetimibe  10 MG tablet Commonly known as: ZETIA  Take 10 mg by mouth every evening.   finasteride  5 MG  tablet Commonly known as: PROSCAR  Take 5 mg by mouth daily.   methocarbamol  500 MG tablet Commonly known as: ROBAXIN  Take 1 tablet (500 mg total) by mouth every 8 (eight) hours as needed for muscle spasms.   multivitamin with minerals Tabs tablet Take 1 tablet by mouth daily.   mupirocin  ointment 2 % Commonly known as: BACTROBAN  Place 1 Application into the nose 2 (two) times daily for 60 doses. Use as directed 2 times daily for 5 days every other week for 6 weeks. What changed: Another medication with the same name was added. Make sure you understand how and when to take each.   mupirocin  ointment 2 % Commonly known as: BACTROBAN  Place 1 Application into the nose 2 (two) times daily for 60 doses. Use as  directed 2 times daily for 5 days every other week for 6 weeks. What changed: You were already taking a medication with the same name, and this prescription was added. Make sure you understand how and when to take each.   nitroGLYCERIN 0.4 MG SL tablet Commonly known as: NITROSTAT Place 0.4 mg under the tongue every 5 (five) minutes as needed for chest pain.   omeprazole 20 MG capsule Commonly known as: PRILOSEC Take 20-40 mg by mouth See admin instructions. Take 20 mg by mouth every other day, alternating with 40 mg on alternate days   pravastatin  40 MG tablet Commonly known as: PRAVACHOL  Take 40 mg by mouth daily.        Follow-up Information     Cristy Bonner DASEN, MD Follow up on 07/29/2024.   Specialty: Orthopedic Surgery Why: @ 2 pm, For wound re-check Contact information: [address] [place] [address] [phone]                  Aleck Stalling, PA-C 07/28/24

## 2024-09-29 NOTE — Progress Notes (Signed)
 Date of COVID positive in last 90 days:  PCP - Franky Molt, PA Cardiologist - Darice Sharps, MD LOV 07/01/24  Chest x-ray - N/A EKG - 07/02/24 Epic Stress Test - 03/24/24 Epic ECHO - N/A Cardiac Cath - N/A Pacemaker/ICD device last checked:N/A Spinal Cord Stimulator:N/A  Bowel Prep - N/A  Sleep Study - N/A CPAP -   Fasting Blood Sugar - N/A Checks Blood Sugar _____ times a day  Last dose of GLP1 agonist-  N/A GLP1 instructions:  Do not take after     Last dose of SGLT-2 inhibitors-  N/A SGLT-2 instructions:  Do not take after     Blood Thinner Instructions: Pletal, Plavix Aspirin  Instructions: ASA 81 Last Dose:  Activity level:  Can go up a flight of stairs and perform activities of daily living without stopping and without symptoms of chest pain or shortness of breath.  Able to exercise without symptoms  Unable to go up a flight of stairs without symptoms of     Anesthesia review: CAD, stroke, anemia, aneurysm, COPD, HTN, PVD  Patient denies shortness of breath, fever, cough and chest pain at PAT appointment  Patient verbalized understanding of instructions that were given to them at the PAT appointment. Patient was also instructed that they will need to review over the PAT instructions again at home before surgery.

## 2024-09-29 NOTE — Patient Instructions (Signed)
 SURGICAL WAITING ROOM VISITATION  Patients having surgery or a procedure may have no more than 2 support people in the waiting area - these visitors may rotate.    Children under the age of 19 must have an adult with them who is not the patient.  Visitors with respiratory illnesses are discouraged from visiting and should remain at home.  If the patient needs to stay at the hospital during part of their recovery, the visitor guidelines for inpatient rooms apply. Pre-op nurse will coordinate an appropriate time for 1 support person to accompany patient in pre-op.  This support person may not rotate.    Please refer to the [facility] website for the visitor guidelines for Inpatients (after your surgery is over and you are in a regular room).    Your procedure is scheduled on: 10/04/24   Report to [facility] Main Entrance    Report to admitting at 12:30 PM   Call this number if you have problems the morning of surgery [phone]   Do not eat food or drink liquids :After Midnight.          If you have questions, please contact your surgeon's office.   FOLLOW BOWEL PREP AND ANY ADDITIONAL PRE OP INSTRUCTIONS YOU RECEIVED FROM YOUR SURGEON'S OFFICE!!!     Oral Hygiene is also important to reduce your risk of infection.                                    Remember - BRUSH YOUR TEETH THE MORNING OF SURGERY WITH YOUR REGULAR TOOTHPASTE  DENTURES WILL BE REMOVED PRIOR TO SURGERY PLEASE DO NOT APPLY Poly grip OR ADHESIVES!!!   Stop all vitamins and herbal supplements 7 days before surgery.   Take these medicines the morning of surgery with A SIP OF WATER : Finasteride , Omeprazole, Percocet, Pravastatin                               You may not have any metal on your body including jewelry, and body piercing             Do not wear lotions, powders, cologne, or deodorant              Men may shave face and neck.   Do not bring valuables to the hospital. [facility] IS NOT              RESPONSIBLE   FOR VALUABLES.   Contacts, glasses, dentures or bridgework may not be worn into surgery.   Bring small overnight bag day of surgery.   DO NOT BRING YOUR HOME MEDICATIONS TO THE HOSPITAL. PHARMACY WILL DISPENSE MEDICATIONS LISTED ON YOUR MEDICATION LIST TO YOU DURING YOUR ADMISSION IN THE HOSPITAL!              Please read over the following fact sheets you were given: IF YOU HAVE QUESTIONS ABOUT YOUR PRE-OP INSTRUCTIONS PLEASE CALL [phone] [place].   If you received a COVID test during your pre-op visit  it is requested that you wear a mask when out in public, stay away from anyone that may not be feeling well and notify your surgeon if you develop symptoms. If you test positive for Covid or have been in contact with anyone that has tested positive in the last 10 days please notify you surgeon.  Pre-operative 4 CHG Bath Instructions  DYNA-Hex 4 Chlorhexidine  Gluconate 4% Solution Antiseptic 4 fl. oz   You can play a key role in reducing the risk of infection after surgery. Your skin needs to be as free of germs as possible. You can reduce the number of germs on your skin by washing with CHG (chlorhexidine  gluconate) soap before surgery. CHG is an antiseptic soap that kills germs and continues to kill germs even after washing.   DO NOT use if you have an allergy to chlorhexidine /CHG or antibacterial soaps. If your skin becomes reddened or irritated, stop using the CHG and notify one of our RNs at   Please shower with the CHG soap starting 4 days before surgery using the following schedule:     Please keep in mind the following:  DO NOT shave, including legs and underarms, starting the day of your first shower.   You may shave your face at any point before/day of surgery.  Place clean sheets on your bed the day you start using CHG soap. Use a clean washcloth (not used since being washed) for each shower. DO NOT sleep with pets once you start using the  CHG.  CHG Shower Instructions:  If you choose to wash your hair and private area, wash first with your normal shampoo/soap.  After you use shampoo/soap, rinse your hair and body thoroughly to remove shampoo/soap residue.  Turn the water  OFF and apply about 3 tablespoons (45 ml) of CHG soap to a CLEAN washcloth.  Apply CHG soap ONLY FROM YOUR NECK DOWN TO YOUR TOES (washing for 3-5 minutes)  DO NOT use CHG soap on face, private areas, open wounds, or sores.  Pay special attention to the area where your surgery is being performed.  If you are having back surgery, having someone wash your back for you may be helpful. Wait 2 minutes after CHG soap is applied, then you may rinse off the CHG soap.  Pat dry with a clean towel  Put on clean clothes/pajamas   If you choose to wear lotion, please use ONLY the CHG-compatible lotions on the back of this paper.     Additional instructions for the day of surgery: DO NOT APPLY any lotions, deodorants, cologne, or perfumes.   Put on clean/comfortable clothes.  Brush your teeth.  Ask your nurse before applying any prescription medications to the skin.   CHG Compatible Lotions   Aveeno Moisturizing lotion  Cetaphil Moisturizing Cream  Cetaphil Moisturizing Lotion  Clairol Herbal Essence Moisturizing Lotion, Dry Skin  Clairol Herbal Essence Moisturizing Lotion, Extra Dry Skin  Clairol Herbal Essence Moisturizing Lotion, Normal Skin  Curel Age Defying Therapeutic Moisturizing Lotion with Alpha Hydroxy  Curel Extreme Care Body Lotion  Curel Soothing Hands Moisturizing Hand Lotion  Curel Therapeutic Moisturizing Cream, Fragrance-Free  Curel Therapeutic Moisturizing Lotion, Fragrance-Free  Curel Therapeutic Moisturizing Lotion, Original Formula  Eucerin Daily Replenishing Lotion  Eucerin Dry Skin Therapy Plus Alpha Hydroxy Crme  Eucerin Dry Skin Therapy Plus Alpha Hydroxy Lotion  Eucerin Original Crme  Eucerin Original Lotion  Eucerin Plus Crme  Eucerin Plus Lotion  Eucerin TriLipid Replenishing Lotion  Keri Anti-Bacterial Hand Lotion  Keri Deep Conditioning Original Lotion Dry Skin Formula Softly Scented  Keri Deep Conditioning Original Lotion, Fragrance Free Sensitive Skin Formula  Keri Lotion Fast Absorbing Fragrance Free Sensitive Skin Formula  Keri Lotion Fast Absorbing Softly Scented Dry Skin Formula  Keri Original Lotion  Keri Skin Renewal Lotion Keri Silky Smooth Lotion  [place]  Silky Smooth Sensitive Skin Lotion  Nivea Body Creamy Conditioning Oil  Nivea Body Extra Enriched Lotion  Nivea Body Original Lotion  Nivea Body Sheer Moisturizing Lotion Nivea Crme  Nivea Skin Firming Lotion  NutraDerm 30 Skin Lotion  NutraDerm Skin Lotion  NutraDerm Therapeutic Skin Cream  NutraDerm Therapeutic Skin Lotion  ProShield Protective Hand Cream  Provon moisturizing lotion ________________________________________________________________________  [facility]- Preparing for Total Shoulder Arthroplasty    Before surgery, you can play an important role. Because skin is not sterile, your skin needs to be as free of germs as possible. You can reduce the number of germs on your skin by using the following products. Benzoyl Peroxide Gel Reduces the number of germs present on the skin Applied twice a day to shoulder area starting two days before surgery    ==================================================================  Please follow these instructions carefully:  BENZOYL PEROXIDE 5% GEL  Please do not use if you have an allergy to benzoyl peroxide.   If your skin becomes reddened/irritated stop using the benzoyl peroxide.  Starting two days before surgery, apply as follows: Apply benzoyl peroxide in the morning and at night. Apply after taking a shower. If you are not taking a shower clean entire shoulder front, back, and side along with the armpit with a clean wet washcloth.  Place a quarter-sized dollop on your shoulder and rub  in thoroughly, making sure to cover the front, back, and side of your shoulder, along with the armpit.   2 days before ____ AM   ____ PM              1 day before ____ AM   ____ PM                         Do this twice a day for two days.  (Last application is the night before surgery, AFTER using the CHG soap as described below).  Do NOT apply benzoyl peroxide gel on the day of surgery.

## 2024-09-29 NOTE — H&P (Signed)
 PREOPERATIVE H&P  Chief Complaint: Dislocation of prosthetic joint of shoulder  HPI: Roy Caldwell is a 68 y.o. male who presents for preoperative history and physical prior to scheduled surgery, Procedure(s): REVISION, REVERSE TOTAL ARTHROPLASTY, SHOULDER.   A 68 year old male presents with a shoulder dislocation. The dislocation occurred yesterday at 4:00 PM. He took his last dose of anticoagulant yesterday morning. The patient has a history of previous surgeries, with the first surgery on August 27 with ORIF of scapula fracture, revision of malpositioned implants of reverse and a second on September 17 for instability  Symptoms are rated as moderate to severe, and have been worsening.  This is significantly impairing activities of daily living.    Please see clinic note for further details on this patient's care.    He has elected for surgical management.   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 Wadie Counter   EYE SURGERY     FB removal   HERNIA REPAIR     ;umbilical   JOINT REPLACEMENT Right 2015   total shoulder done by Dr. Lewanda at [facility]   OPEN TREATMENT, DISLOCATION, DISTAL RADIOULNAR JOINT Left 07/26/2024   Procedure: OPEN TREATMENT, DISLOCATION;  Surgeon: Cristy Bonner DASEN, MD;  Location: [facility];   Service: Orthopedics;  Laterality: Left;  ADD CODE 76544 (ANTERIOR CAPSULE ORBITER)   ORIF SHOULDER FRACTURE Left 07/07/2024   Procedure: OPEN REDUCTION INTERNAL FIXATION (ORIF) SHOULDER FRACTURE;  Surgeon: Cristy Bonner DASEN, MD;  Location: [facility];  Service: Orthopedics;  Laterality: Left;   REPLACEMENT TOTAL KNEE Bilateral    right  2015   left  2023   REVISION TOTAL SHOULDER TO REVERSE TOTAL SHOULDER Left 07/07/2024   Procedure: REVISION, REVERSE TOTAL ARTHROPLASTY, SHOULDER;  Surgeon: Cristy Bonner DASEN, MD;  Location: [facility];  Service: Orthopedics;  Laterality: Left;   REVISION TOTAL SHOULDER TO REVERSE TOTAL SHOULDER Left 07/26/2024   Procedure: REVISION, REVERSE TOTAL ARTHROPLASTY, SHOULDER;  Surgeon: Cristy Bonner DASEN, MD;  Location: [facility];  Service: Orthopedics;  Laterality: Left;   SYNOVECTOMY Left 07/26/2024   Procedure: SYNOVECTOMY;  Surgeon: Cristy Bonner DASEN, MD;  Location: [facility];  Service: Orthopedics;  Laterality: Left;   TOTAL SHOULDER REPLACEMENT Left 2023   done at [facility]  by Dr. Lewanda   Social History   Socioeconomic History   Marital status: Married    Spouse name: Not on file   Number of children: Not on file   Years of education: Not on file   Highest education level: Not on file  Occupational History   Not on file  Tobacco Use   Smoking  status: Former    Types: Cigarettes    Start date: 2023    Quit date: 1973    Years since quitting: 52.9   Smokeless tobacco: Never  Vaping Use   Vaping status: Never Used  Substance and Sexual Activity   Alcohol use: Not Currently   Drug use: Not Currently   Sexual activity: Not Currently  Other Topics Concern   Not on file  Social History Narrative   Not on file   Social Drivers of Health   Financial Resource Strain: Not on file  Food Insecurity: No Food Insecurity (07/26/2024)   Hunger Vital Sign    Worried About Running Out of Food in the Last Year: Never true    Ran Out of Food in the Last Year: Never true  Transportation Needs:  No Transportation Needs (07/26/2024)   PRAPARE - Administrator, Civil Service (Medical): No    Lack of Transportation (Non-Medical): No  Physical Activity: Not on file  Stress: Not on file  Social Connections: Socially Integrated (07/26/2024)   Social Connection and Isolation Panel    Frequency of Communication with Friends and Family: Twice a week    Frequency of Social Gatherings with Friends and Family: Once a week    Attends Religious Services: More than 4 times per year    Active Member of [employer] or Organizations: No    Attends Engineer, Structural: More than 4 times per year    Marital Status: Married   No family history on file. Allergies  Allergen Reactions   Bee Venom Anaphylaxis and Hives   Shellfish Allergy Anaphylaxis, Hives and Dermatitis    Pt can tolerate shrimp   Statins    Tape Itching    Adhesive   Prior to Admission medications   Medication Sig Start Date End Date Taking? Authorizing Provider  alfuzosin  (UROXATRAL ) 10 MG 24 hr tablet Take 10 mg by mouth daily.   Yes [provider]  aspirin  EC 81 MG tablet Take 81 mg by mouth daily. Swallow whole.   Yes [provider]  Cholecalciferol (VITAMIN D3) 250 MCG (10000 UT) capsule Take 10,000 Units by mouth daily.   Yes [provider]  cilostazol (PLETAL) 50 MG tablet Take 50 mg by mouth 2 (two) times daily. 04/07/24 04/07/25 Yes [provider]  clopidogrel (PLAVIX) 75 MG tablet Take 75 mg by mouth daily.   Yes [provider]  cyanocobalamin 1000 MCG tablet Take 1,000 mcg by mouth daily.   Yes [provider]  diclofenac  (VOLTAREN ) 75 MG EC tablet Take 1 tablet (75 mg total) by mouth 2 (two) times daily. 07/27/24  Yes Edrees Valent, Aleck SAILOR, PA-C  ezetimibe  (ZETIA ) 10 MG tablet Take 10 mg by mouth every evening.   Yes [provider]  finasteride  (PROSCAR ) 5 MG tablet Take 5 mg by mouth daily.   Yes [provider]  methocarbamol   (ROBAXIN ) 500 MG tablet Take 1 tablet (500 mg total) by mouth every 8 (eight) hours as needed for muscle spasms. 07/27/24  Yes Alaiyah Bollman N, PA-C  Multiple Vitamin (MULTIVITAMIN WITH MINERALS) TABS tablet Take 1 tablet by mouth daily. Centrum   Yes [provider]  nitroGLYCERIN (NITROSTAT) 0.4 MG SL tablet Place 0.4 mg under the tongue every 5 (five) minutes as needed for chest pain.   Yes [provider]  omeprazole (PRILOSEC) 20 MG capsule Take 40 mg by mouth in the morning. 09/17/17  Yes [provider]  oxyCODONE -acetaminophen  (PERCOCET) 10-325 MG tablet Take 1 tablet by mouth every 4 (four) hours as needed for pain. for pain   Yes [provider]  pravastatin  (PRAVACHOL ) 40 MG tablet Take 40 mg by mouth daily.   Yes [provider]  sildenafil (REVATIO) 20 MG tablet Take 20 mg by mouth daily as needed (ED).   Yes [provider]    ROS: All other systems have been reviewed and were otherwise negative with the exception of those mentioned in the HPI and as above.  Physical Exam: General: Alert, no acute distress Cardiovascular: No pedal edema Respiratory: No cyanosis, no use of accessory musculature GI: No organomegaly, abdomen is soft and non-tender Skin: No lesions in the area of chief complaint Neurologic: Sensation intact distally Psychiatric: Patient is competent for consent with normal mood and affect Lymphatic: No axillary or cervical lymphadenopathy  MUSCULOSKELETAL:  On examination, the patient's shoulder is dislocated and could not be reduced in the clinic. There is tenderness around the shoulder joint, and the patient reports discomfort. The scapula appears well-aligned with no movement of screws or plates. Neurovascular status is intact, and axillary nerve sensory function is normal but unable to test deltoid  Imaging: X-rays reviewed show a well-healed scapula with no movement of screws or plates. Obvious  dislocation  BMI: Estimated body mass index is 32.11 kg/m as calculated from the following:   Height as of 07/26/24: 5' 7 (1.702 m).   Weight as of 07/26/24: 93 kg.  Lab Results  Component Value Date   ALBUMIN  3.6 07/02/2024   Diabetes: Patient does not have a diagnosis of diabetes.     Smoking Status:      Assessment: Dislocation of prosthetic joint of shoulder  Plan: Plan for Procedure(s): REVISION, REVERSE TOTAL ARTHROPLASTY, SHOULDER  The risks benefits and alternatives were discussed with the patient including but not limited to the risks of nonoperative treatment, versus surgical intervention including infection, bleeding, nerve injury,  blood clots, cardiopulmonary complications, morbidity, mortality, among others, and they were willing to proceed.   We additionally specifically discussed risks of axillary nerve injury, infection, periprosthetic fracture, continued pain and longevity of implants prior to beginning procedure.    Patient will be admitted for inpatient treatment for surgery, pain control, OT, prophylactic antibiotics, VTE prophylaxis, and discharge planning. The patient is planning to be discharged home with outpatient PT.   The patient acknowledged the explanation, agreed to proceed with the plan and consent was signed.   Operative Plan: Left shoulder open treatment of shoulder dislocation and revision to reverse total shoulder arthroplasty Discharge Medications: standard DVT Prophylaxis: resume home meds Physical Therapy: delayed Special Discharge needs: +/-   Aleck LOISE Stalling, PA-C  09/29/2024 1:27 PM

## 2024-09-30 ENCOUNTER — Encounter (HOSPITAL_COMMUNITY): Payer: Self-pay

## 2024-09-30 ENCOUNTER — Encounter (HOSPITAL_COMMUNITY)
Admission: RE | Admit: 2024-09-30 | Discharge: 2024-09-30 | Disposition: A | Discharge status: Home / Self Care | Source: Ambulatory Visit | Attending: Orthopaedic Surgery | Admitting: Orthopaedic Surgery

## 2024-09-30 VITALS — BP 137/59 | HR 83 | Temp 98.5°F | Resp 14 | Ht 67.0 in | Wt 215.0 lb

## 2024-09-30 DIAGNOSIS — I10 Essential (primary) hypertension: Secondary | ICD-10-CM | POA: Insufficient documentation

## 2024-09-30 DIAGNOSIS — Z01818 Encounter for other preprocedural examination: Secondary | ICD-10-CM

## 2024-09-30 DIAGNOSIS — Z01812 Encounter for preprocedural laboratory examination: Secondary | ICD-10-CM | POA: Insufficient documentation

## 2024-09-30 HISTORY — DX: Acute myocardial infarction, unspecified: I21.9

## 2024-09-30 LAB — BASIC METABOLIC PANEL WITH GFR
Anion gap: 11 (ref 5–15)
BUN: 15 mg/dL (ref 8–23)
CO2: 24 mmol/L (ref 22–32)
Calcium: 9.5 mg/dL (ref 8.9–10.3)
Chloride: 103 mmol/L (ref 98–111)
Creatinine, Ser: 0.75 mg/dL (ref 0.61–1.24)
GFR, Estimated: >60 mL/min (ref >60)
Glucose, Bld: 96 mg/dL (ref 70–99)
Potassium: 4 mmol/L (ref 3.5–5.1)
Sodium: 138 mmol/L (ref 135–145)

## 2024-09-30 LAB — SURGICAL PCR SCREEN
MRSA, PCR: NEGATIVE
Staphylococcus aureus: NEGATIVE

## 2024-09-30 LAB — CBC
HCT: 36.9 % — ABNORMAL LOW (ref 39.0–52.0)
Hemoglobin: 11.8 g/dL — ABNORMAL LOW (ref 13.0–17.0)
MCH: 29.4 pg (ref 26.0–34.0)
MCHC: 32 g/dL (ref 30.0–36.0)
MCV: 92 fL (ref 80.0–100.0)
Platelets: 277 K/uL (ref 150–400)
RBC: 4.01 MIL/uL — ABNORMAL LOW (ref 4.22–5.81)
RDW: 13 % (ref 11.5–15.5)
WBC: 8.4 K/uL (ref 4.0–10.5)
nRBC: 0 % (ref 0.0–0.2)

## 2024-10-04 ENCOUNTER — Other Ambulatory Visit: Payer: Self-pay

## 2024-10-04 ENCOUNTER — Inpatient Hospital Stay (HOSPITAL_COMMUNITY)
Admission: RE | Admit: 2024-10-04 | Discharge: 2024-10-05 | DRG: 483 | Disposition: A | Discharge status: Home / Self Care | Attending: Orthopaedic Surgery | Admitting: Orthopaedic Surgery

## 2024-10-04 ENCOUNTER — Encounter (HOSPITAL_COMMUNITY): Admission: RE | Disposition: A | Payer: Self-pay | Source: Home / Self Care | Attending: Orthopaedic Surgery

## 2024-10-04 ENCOUNTER — Encounter (HOSPITAL_COMMUNITY): Payer: Self-pay | Admitting: Orthopaedic Surgery

## 2024-10-04 ENCOUNTER — Inpatient Hospital Stay (HOSPITAL_COMMUNITY): Admitting: Certified Registered Nurse Anesthetist

## 2024-10-04 ENCOUNTER — Inpatient Hospital Stay (HOSPITAL_COMMUNITY)

## 2024-10-04 ENCOUNTER — Inpatient Hospital Stay (HOSPITAL_COMMUNITY): Payer: Self-pay | Admitting: Physician Assistant

## 2024-10-04 DIAGNOSIS — Z7902 Long term (current) use of antithrombotics/antiplatelets: Secondary | ICD-10-CM

## 2024-10-04 DIAGNOSIS — K219 Gastro-esophageal reflux disease without esophagitis: Secondary | ICD-10-CM | POA: Diagnosis present

## 2024-10-04 DIAGNOSIS — I252 Old myocardial infarction: Secondary | ICD-10-CM

## 2024-10-04 DIAGNOSIS — I2510 Atherosclerotic heart disease of native coronary artery without angina pectoris: Secondary | ICD-10-CM | POA: Diagnosis present

## 2024-10-04 DIAGNOSIS — J449 Chronic obstructive pulmonary disease, unspecified: Secondary | ICD-10-CM | POA: Diagnosis present

## 2024-10-04 DIAGNOSIS — Z91013 Allergy to seafood: Secondary | ICD-10-CM | POA: Diagnosis not present

## 2024-10-04 DIAGNOSIS — Z96612 Presence of left artificial shoulder joint: Principal | ICD-10-CM

## 2024-10-04 DIAGNOSIS — Z79899 Other long term (current) drug therapy: Secondary | ICD-10-CM | POA: Diagnosis not present

## 2024-10-04 DIAGNOSIS — Z888 Allergy status to other drugs, medicaments and biological substances status: Secondary | ICD-10-CM | POA: Diagnosis not present

## 2024-10-04 DIAGNOSIS — Z91030 Bee allergy status: Secondary | ICD-10-CM

## 2024-10-04 DIAGNOSIS — Z8673 Personal history of transient ischemic attack (TIA), and cerebral infarction without residual deficits: Secondary | ICD-10-CM | POA: Diagnosis not present

## 2024-10-04 DIAGNOSIS — Z87891 Personal history of nicotine dependence: Secondary | ICD-10-CM | POA: Diagnosis not present

## 2024-10-04 DIAGNOSIS — Y792 Prosthetic and other implants, materials and accessory orthopedic devices associated with adverse incidents: Secondary | ICD-10-CM | POA: Diagnosis present

## 2024-10-04 DIAGNOSIS — Z91048 Other nonmedicinal substance allergy status: Secondary | ICD-10-CM

## 2024-10-04 DIAGNOSIS — T84028A Dislocation of other internal joint prosthesis, initial encounter: Secondary | ICD-10-CM | POA: Diagnosis present

## 2024-10-04 DIAGNOSIS — H9190 Unspecified hearing loss, unspecified ear: Secondary | ICD-10-CM | POA: Diagnosis present

## 2024-10-04 DIAGNOSIS — T8459XD Infection and inflammatory reaction due to other internal joint prosthesis, subsequent encounter: Secondary | ICD-10-CM | POA: Diagnosis not present

## 2024-10-04 DIAGNOSIS — Z7982 Long term (current) use of aspirin: Secondary | ICD-10-CM | POA: Diagnosis not present

## 2024-10-04 DIAGNOSIS — I129 Hypertensive chronic kidney disease with stage 1 through stage 4 chronic kidney disease, or unspecified chronic kidney disease: Secondary | ICD-10-CM | POA: Diagnosis present

## 2024-10-04 DIAGNOSIS — I739 Peripheral vascular disease, unspecified: Secondary | ICD-10-CM | POA: Diagnosis present

## 2024-10-04 DIAGNOSIS — I10 Essential (primary) hypertension: Secondary | ICD-10-CM

## 2024-10-04 DIAGNOSIS — Z96653 Presence of artificial knee joint, bilateral: Secondary | ICD-10-CM | POA: Diagnosis present

## 2024-10-04 DIAGNOSIS — E785 Hyperlipidemia, unspecified: Secondary | ICD-10-CM | POA: Diagnosis present

## 2024-10-04 DIAGNOSIS — N189 Chronic kidney disease, unspecified: Secondary | ICD-10-CM | POA: Diagnosis present

## 2024-10-04 DIAGNOSIS — B957 Other staphylococcus as the cause of diseases classified elsewhere: Secondary | ICD-10-CM | POA: Diagnosis not present

## 2024-10-04 HISTORY — PX: SHOULDER OPEN ROTATOR CUFF REPAIR: SHX2407

## 2024-10-04 HISTORY — PX: SYNOVECTOMY: SHX5180

## 2024-10-04 HISTORY — PX: REVISION TOTAL SHOULDER TO REVERSE TOTAL SHOULDER: SHX6313

## 2024-10-04 LAB — TYPE AND SCREEN
ABO/RH(D): A POS
Antibody Screen: NEGATIVE

## 2024-10-04 MED ORDER — CELECOXIB 200 MG PO CAPS
200.0000 mg | ORAL_CAPSULE | Freq: Two times a day (BID) | ORAL | Status: DC
  Administered 2024-10-04 – 2024-10-05 (×2): 200 mg via ORAL
  Filled 2024-10-04 (×2): qty 1

## 2024-10-04 MED ORDER — PANTOPRAZOLE SODIUM 40 MG PO TBEC
40.0000 mg | DELAYED_RELEASE_TABLET | Freq: Every day | ORAL | Status: DC
  Administered 2024-10-04 – 2024-10-05 (×2): 40 mg via ORAL
  Filled 2024-10-04 (×2): qty 1

## 2024-10-04 MED ORDER — FENTANYL CITRATE (PF) 50 MCG/ML IJ SOSY: 25.0000 ug | PREFILLED_SYRINGE | INTRAMUSCULAR | Status: DC | PRN

## 2024-10-04 MED ORDER — LIDOCAINE HCL (PF) 2 % IJ SOLN
INTRAMUSCULAR | Status: AC
  Filled 2024-10-04: qty 5

## 2024-10-04 MED ORDER — DOCUSATE SODIUM 100 MG PO CAPS
100.0000 mg | ORAL_CAPSULE | Freq: Two times a day (BID) | ORAL | Status: DC
  Administered 2024-10-04 – 2024-10-05 (×2): 100 mg via ORAL
  Filled 2024-10-04 (×2): qty 1

## 2024-10-04 MED ORDER — CHLORHEXIDINE GLUCONATE 0.12 % MT SOLN
15.0000 mL | Freq: Once | OROMUCOSAL | Status: AC
  Administered 2024-10-04: 15 mL via OROMUCOSAL

## 2024-10-04 MED ORDER — STERILE WATER FOR IRRIGATION IR SOLN
Status: DC | PRN
  Administered 2024-10-04: 2000 mL

## 2024-10-04 MED ORDER — OXYCODONE HCL 5 MG PO TABS: 5.0000 mg | ORAL_TABLET | ORAL | Status: DC | PRN

## 2024-10-04 MED ORDER — FENTANYL CITRATE (PF) 50 MCG/ML IJ SOSY
PREFILLED_SYRINGE | INTRAMUSCULAR | Status: AC
  Administered 2024-10-04: 50 ug via INTRAVENOUS
  Filled 2024-10-04: qty 2

## 2024-10-04 MED ORDER — DIPHENHYDRAMINE HCL 12.5 MG/5ML PO ELIX: 12.5000 mg | ORAL_SOLUTION | ORAL | Status: DC | PRN

## 2024-10-04 MED ORDER — BUPIVACAINE LIPOSOME 1.3 % IJ SUSP
INTRAMUSCULAR | Status: DC | PRN
  Administered 2024-10-04: 10 mL

## 2024-10-04 MED ORDER — PROPOFOL 10 MG/ML IV BOLUS
INTRAVENOUS | Status: DC | PRN
  Administered 2024-10-04: 20 mg via INTRAVENOUS
  Administered 2024-10-04: 100 mg via INTRAVENOUS
  Administered 2024-10-04: 20 mg via INTRAVENOUS
  Administered 2024-10-04: 30 mg via INTRAVENOUS

## 2024-10-04 MED ORDER — ROCURONIUM BROMIDE 10 MG/ML (PF) SYRINGE
PREFILLED_SYRINGE | INTRAVENOUS | Status: DC | PRN
  Administered 2024-10-04: 70 mg via INTRAVENOUS

## 2024-10-04 MED ORDER — ONDANSETRON HCL 4 MG/2ML IJ SOLN
INTRAMUSCULAR | Status: DC | PRN
  Administered 2024-10-04: 4 mg via INTRAVENOUS

## 2024-10-04 MED ORDER — CEFAZOLIN SODIUM-DEXTROSE 2-4 GM/100ML-% IV SOLN
2.0000 g | Freq: Four times a day (QID) | INTRAVENOUS | Status: AC
  Administered 2024-10-04 – 2024-10-05 (×2): 2 g via INTRAVENOUS
  Filled 2024-10-04 (×2): qty 100

## 2024-10-04 MED ORDER — ORAL CARE MOUTH RINSE: 15.0000 mL | Freq: Once | OROMUCOSAL | Status: AC

## 2024-10-04 MED ORDER — CEFAZOLIN SODIUM-DEXTROSE 2-4 GM/100ML-% IV SOLN
2.0000 g | INTRAVENOUS | Status: AC
  Administered 2024-10-04: 2 g via INTRAVENOUS
  Filled 2024-10-04: qty 100

## 2024-10-04 MED ORDER — PHENYLEPHRINE HCL-NACL 20-0.9 MG/250ML-% IV SOLN
INTRAVENOUS | Status: DC | PRN
  Administered 2024-10-04: 40 ug via INTRAVENOUS
  Administered 2024-10-04: 25 ug/min via INTRAVENOUS
  Administered 2024-10-04: 40 ug via INTRAVENOUS

## 2024-10-04 MED ORDER — EPHEDRINE SULFATE (PRESSORS) 25 MG/5ML IV SOSY
PREFILLED_SYRINGE | INTRAVENOUS | Status: DC | PRN
  Administered 2024-10-04 (×3): 5 mg via INTRAVENOUS

## 2024-10-04 MED ORDER — ACETAMINOPHEN 10 MG/ML IV SOLN: 1000.0000 mg | Freq: Once | INTRAVENOUS | Status: DC | PRN

## 2024-10-04 MED ORDER — METOCLOPRAMIDE HCL 5 MG PO TABS: 5.0000 mg | ORAL_TABLET | Freq: Three times a day (TID) | ORAL | Status: DC | PRN

## 2024-10-04 MED ORDER — OXYCODONE HCL 5 MG/5ML PO SOLN: 5.0000 mg | Freq: Once | ORAL | Status: DC | PRN

## 2024-10-04 MED ORDER — ACETAMINOPHEN 500 MG PO TABS
1000.0000 mg | ORAL_TABLET | Freq: Once | ORAL | Status: AC
  Administered 2024-10-04: 1000 mg via ORAL
  Filled 2024-10-04: qty 2

## 2024-10-04 MED ORDER — AMISULPRIDE (ANTIEMETIC) 5 MG/2ML IV SOLN
10.0000 mg | Freq: Once | INTRAVENOUS | Status: DC | PRN
Start: 2024-10-04 — End: 2024-10-04

## 2024-10-04 MED ORDER — LIDOCAINE HCL (PF) 2 % IJ SOLN
INTRAMUSCULAR | Status: DC | PRN
  Administered 2024-10-04: 100 mg via INTRADERMAL

## 2024-10-04 MED ORDER — EZETIMIBE 10 MG PO TABS
10.0000 mg | ORAL_TABLET | Freq: Every evening | ORAL | Status: DC
  Administered 2024-10-04: 10 mg via ORAL
  Filled 2024-10-04: qty 1

## 2024-10-04 MED ORDER — FENTANYL CITRATE (PF) 50 MCG/ML IJ SOSY: 50.0000 ug | PREFILLED_SYRINGE | INTRAMUSCULAR | Status: DC

## 2024-10-04 MED ORDER — FENTANYL CITRATE (PF) 100 MCG/2ML IJ SOLN
INTRAMUSCULAR | Status: AC
  Filled 2024-10-04: qty 2

## 2024-10-04 MED ORDER — LACTATED RINGERS IV SOLN: INTRAVENOUS | Status: DC

## 2024-10-04 MED ORDER — METHOCARBAMOL 1000 MG/10ML IJ SOLN: 500.0000 mg | Freq: Four times a day (QID) | INTRAMUSCULAR | Status: DC | PRN

## 2024-10-04 MED ORDER — MAGNESIUM CITRATE PO SOLN: 1.0000 | Freq: Once | ORAL | Status: DC | PRN

## 2024-10-04 MED ORDER — MIDAZOLAM HCL (PF) 2 MG/2ML IJ SOLN: 1.0000 mg | INTRAMUSCULAR | Status: DC

## 2024-10-04 MED ORDER — METOCLOPRAMIDE HCL 5 MG/ML IJ SOLN: 5.0000 mg | Freq: Three times a day (TID) | INTRAMUSCULAR | Status: DC | PRN

## 2024-10-04 MED ORDER — ALFUZOSIN HCL ER 10 MG PO TB24
10.0000 mg | ORAL_TABLET | Freq: Every day | ORAL | Status: DC
  Administered 2024-10-05: 10 mg via ORAL
  Filled 2024-10-04: qty 1

## 2024-10-04 MED ORDER — PHENOL 1.4 % MT LIQD: 1.0000 | OROMUCOSAL | Status: DC | PRN

## 2024-10-04 MED ORDER — OXYCODONE HCL 5 MG PO TABS: 5.0000 mg | ORAL_TABLET | Freq: Once | ORAL | Status: DC | PRN

## 2024-10-04 MED ORDER — VANCOMYCIN HCL 1000 MG IV SOLR
INTRAVENOUS | Status: AC
  Filled 2024-10-04: qty 20

## 2024-10-04 MED ORDER — ONDANSETRON HCL 4 MG/2ML IJ SOLN
INTRAMUSCULAR | Status: AC
  Filled 2024-10-04: qty 2

## 2024-10-04 MED ORDER — VANCOMYCIN HCL 1 G IV SOLR
INTRAVENOUS | Status: DC | PRN
  Administered 2024-10-04: 1000 mg via TOPICAL

## 2024-10-04 MED ORDER — FENTANYL CITRATE (PF) 100 MCG/2ML IJ SOLN
INTRAMUSCULAR | Status: DC | PRN
Start: 2024-10-04 — End: 2024-10-04
  Administered 2024-10-04: 50 ug via INTRAVENOUS
  Administered 2024-10-04 (×2): 25 ug via INTRAVENOUS

## 2024-10-04 MED ORDER — PRAVASTATIN SODIUM 20 MG PO TABS
40.0000 mg | ORAL_TABLET | Freq: Every day | ORAL | Status: DC
  Administered 2024-10-05: 40 mg via ORAL
  Filled 2024-10-04: qty 2

## 2024-10-04 MED ORDER — MENTHOL 3 MG MT LOZG: 1.0000 | LOZENGE | OROMUCOSAL | Status: DC | PRN

## 2024-10-04 MED ORDER — HYDROMORPHONE HCL 1 MG/ML IJ SOLN: 0.5000 mg | INTRAMUSCULAR | Status: DC | PRN

## 2024-10-04 MED ORDER — ONDANSETRON HCL 4 MG/2ML IJ SOLN: 4.0000 mg | Freq: Once | INTRAMUSCULAR | Status: DC | PRN

## 2024-10-04 MED ORDER — PROPOFOL 10 MG/ML IV BOLUS
INTRAVENOUS | Status: AC
  Filled 2024-10-04: qty 20

## 2024-10-04 MED ORDER — BUPIVACAINE-EPINEPHRINE (PF) 0.5% -1:200000 IJ SOLN
INTRAMUSCULAR | Status: DC | PRN
  Administered 2024-10-04: 10 mL via PERINEURAL

## 2024-10-04 MED ORDER — ZOLPIDEM TARTRATE 5 MG PO TABS: 5.0000 mg | ORAL_TABLET | Freq: Every evening | ORAL | Status: DC | PRN

## 2024-10-04 MED ORDER — 0.9 % SODIUM CHLORIDE (POUR BTL) OPTIME
TOPICAL | Status: DC | PRN
  Administered 2024-10-04: 1000 mL

## 2024-10-04 MED ORDER — ONDANSETRON HCL 4 MG/2ML IJ SOLN: 4.0000 mg | Freq: Four times a day (QID) | INTRAMUSCULAR | Status: DC | PRN

## 2024-10-04 MED ORDER — ACETAMINOPHEN 500 MG PO TABS
1000.0000 mg | ORAL_TABLET | Freq: Four times a day (QID) | ORAL | Status: DC
  Administered 2024-10-04 – 2024-10-05 (×3): 1000 mg via ORAL
  Filled 2024-10-04 (×3): qty 2

## 2024-10-04 MED ORDER — METHOCARBAMOL 500 MG PO TABS: 500.0000 mg | ORAL_TABLET | Freq: Four times a day (QID) | ORAL | Status: DC | PRN

## 2024-10-04 MED ORDER — MIDAZOLAM HCL 2 MG/2ML IJ SOLN
INTRAMUSCULAR | Status: AC
  Administered 2024-10-04: 1 mg via INTRAVENOUS
  Filled 2024-10-04: qty 2

## 2024-10-04 MED ORDER — ALBUMIN HUMAN 5 % IV SOLN: INTRAVENOUS | Status: DC | PRN

## 2024-10-04 MED ORDER — TRANEXAMIC ACID-NACL 1000-0.7 MG/100ML-% IV SOLN
1000.0000 mg | INTRAVENOUS | Status: AC
  Administered 2024-10-04: 1000 mg via INTRAVENOUS
  Filled 2024-10-04: qty 100

## 2024-10-04 MED ORDER — ROCURONIUM BROMIDE 10 MG/ML (PF) SYRINGE
PREFILLED_SYRINGE | INTRAVENOUS | Status: AC
  Filled 2024-10-04: qty 10

## 2024-10-04 MED ORDER — SUGAMMADEX SODIUM 200 MG/2ML IV SOLN
INTRAVENOUS | Status: DC | PRN
  Administered 2024-10-04: 200 mg via INTRAVENOUS

## 2024-10-04 MED ORDER — SODIUM CHLORIDE 0.9 % IR SOLN
Status: DC | PRN
  Administered 2024-10-04: 4000 mL

## 2024-10-04 MED ORDER — POLYETHYLENE GLYCOL 3350 17 G PO PACK: 17.0000 g | PACK | Freq: Every day | ORAL | Status: DC | PRN

## 2024-10-04 MED ORDER — DEXAMETHASONE SOD PHOSPHATE PF 10 MG/ML IJ SOLN
INTRAMUSCULAR | Status: DC | PRN
  Administered 2024-10-04: 5 mg via INTRAVENOUS

## 2024-10-04 MED ORDER — OXYCODONE HCL 5 MG PO TABS: 10.0000 mg | ORAL_TABLET | ORAL | Status: DC | PRN

## 2024-10-04 MED ORDER — BISACODYL 10 MG RE SUPP: 10.0000 mg | Freq: Every day | RECTAL | Status: DC | PRN

## 2024-10-04 MED ORDER — ONDANSETRON HCL 4 MG PO TABS: 4.0000 mg | ORAL_TABLET | Freq: Four times a day (QID) | ORAL | Status: DC | PRN

## 2024-10-04 NOTE — Anesthesia Procedure Notes (Signed)
 Procedure Name: Intubation Date/Time: 10/04/2024 2:50 PM  Performed by: Joshua Vernell BROCKS, CRNAPre-anesthesia Checklist: Patient identified, Emergency Drugs available, Suction available and Patient being monitored Patient Re-evaluated:Patient Re-evaluated prior to induction Oxygen Delivery Method: Circle system utilized Preoxygenation: Pre-oxygenation with 100% oxygen Induction Type: IV induction Ventilation: Mask ventilation without difficulty and Oral airway inserted - appropriate to patient size Laryngoscope Size: Glidescope and 3 Grade View: Grade I Tube type: Oral Tube size: 7.5 mm Number of attempts: 1 Airway Equipment and Method: Stylet and Oral airway Placement Confirmation: ETT inserted through vocal cords under direct vision, positive ETCO2 and breath sounds checked- equal and bilateral Secured at: 21 cm Tube secured with: Tape Dental Injury: Teeth and Oropharynx as per pre-operative assessment  Difficulty Due To: Difficulty was anticipated and Difficult Airway-  due to neck instability Comments: Glidescope used 2 previous intubations, elected to use glide scope for this intubation as well

## 2024-10-04 NOTE — Interval H&P Note (Signed)
 All questions answered

## 2024-10-04 NOTE — Anesthesia Preprocedure Evaluation (Addendum)
 Anesthesia Evaluation  Patient identified by MRN, date of birth, ID band Patient awake    Reviewed: Allergy & Precautions, NPO status , Patient's Chart, lab work & pertinent test results  History of Anesthesia Complications Negative for: history of anesthetic complications  Airway Mallampati: II  TM Distance: >3 FB Neck ROM: Full    Dental  (+) Teeth Intact, Dental Advisory Given   Pulmonary COPD, former smoker   breath sounds clear to auscultation       Cardiovascular hypertension, Pt. on medications + CAD, + Past MI, + Cardiac Stents and + Peripheral Vascular Disease   Rhythm:Regular Rate:Normal  EKG (06/2024): NSR   Neuro/Psych    GI/Hepatic ,GERD  Medicated and Controlled,,  Endo/Other    Renal/GU Renal disease     Musculoskeletal  (+) Arthritis , Osteoarthritis,    Abdominal   Peds  Hematology  (+) Blood dyscrasia, anemia Hgb 11.8, Plts 277K (09/30/24)   Anesthesia Other Findings   Reproductive/Obstetrics                              Anesthesia Physical Anesthesia Plan  ASA: 3  Anesthesia Plan: General and Regional   Post-op Pain Management:    Induction: Intravenous  PONV Risk Score and Plan: 2 and Ondansetron , Dexamethasone  and Treatment may vary due to age or medical condition  Airway Management Planned: Oral ETT  Additional Equipment: None  Intra-op Plan:   Post-operative Plan: Extubation in OR  Informed Consent:      Dental advisory given  Plan Discussed with: CRNA  Anesthesia Plan Comments: (Plan for PIV x 2, Interscalene regional block, GETA. Type & Screen active. )         Anesthesia Quick Evaluation

## 2024-10-04 NOTE — Transfer of Care (Signed)
 Immediate Anesthesia Transfer of Care Note  Patient: Roy Caldwell [facility]  Procedure(s) Performed: REVISION, REVERSE TOTAL ARTHROPLASTY, SHOULDER (Left: Shoulder) SYNOVECTOMY (Left: Shoulder) REPAIR, ROTATOR CUFF, OPEN (Left: Shoulder)  Patient Location: PACU  Anesthesia Type:General and Regional  Level of Consciousness: drowsy and patient cooperative  Airway & Oxygen Therapy: Patient Spontanous Breathing and Patient connected to face mask oxygen  Post-op Assessment: Report given to RN and Post -op Vital signs reviewed and stable  Post vital signs: Reviewed and stable  Last Vitals:  Vitals Value Taken Time  BP 109/61 10/04/24 17:17  Temp    Pulse 65 10/04/24 17:19  Resp 15 10/04/24 17:19  SpO2 99 % 10/04/24 17:19  Vitals shown include unfiled device data.  Last Pain:  Vitals:   10/04/24 1342  TempSrc:   PainSc: 0-No pain         Complications: No notable events documented.

## 2024-10-04 NOTE — Anesthesia Procedure Notes (Signed)
 Anesthesia Regional Block: Interscalene brachial plexus block   Pre-Anesthetic Checklist: , timeout performed,  Correct Patient, Correct Site, Correct Laterality,  Correct Procedure, Correct Position, site marked,  Risks and benefits discussed,  At surgeon's request and post-op pain management  Laterality: Upper and Left  Prep: chloraprep       Needles:  Injection technique: Single-shot  Needle Type: Echogenic Stimulator Needle     Needle Length: 9cm  Needle Gauge: 20     Additional Needles:   Procedures:, nerve stimulator,,, ultrasound used (permanent image in chart),, #20gu IV placed     Nerve Stimulator or Paresthesia:  Response: Twitch elicited, 0.6 mA, 0.3 ms  Additional Responses:   Narrative:  Start time: 10/04/2024 1:33 PM End time: 10/04/2024 1:33 PM Injection made incrementally with aspirations every 5 mL.  Performed by: Personally

## 2024-10-04 NOTE — Anesthesia Postprocedure Evaluation (Signed)
 Anesthesia Post Note  Patient: Roy Caldwell  Procedure(s) Performed: REVISION, REVERSE TOTAL ARTHROPLASTY, SHOULDER (Left: Shoulder) SYNOVECTOMY (Left: Shoulder) REPAIR, ROTATOR CUFF, OPEN (Left: Shoulder)     Patient location during evaluation: PACU Anesthesia Type: Regional and General Level of consciousness: awake and alert Pain management: pain level controlled Vital Signs Assessment: post-procedure vital signs reviewed and stable Respiratory status: spontaneous breathing, nonlabored ventilation, respiratory function stable and patient connected to nasal cannula oxygen Cardiovascular status: blood pressure returned to baseline and stable Postop Assessment: no apparent nausea or vomiting Anesthetic complications: no   No notable events documented.  Last Vitals:  Vitals:   10/04/24 1745 10/04/24 1828  BP: 114/68 135/68  Pulse: (!) 57 (!) 58  Resp: 13 18  Temp:  (!) 36.4 C  SpO2: 95% 95%    Last Pain:  Vitals:   10/04/24 1828  TempSrc: Oral  PainSc:                  Garnette DELENA Gab

## 2024-10-05 MED ORDER — METHOCARBAMOL 500 MG PO TABS: 500.0000 mg | ORAL_TABLET | Freq: Three times a day (TID) | ORAL | 0 refills | Status: AC | PRN

## 2024-10-05 MED ORDER — AMOXICILLIN 500 MG PO TABS: 500.0000 mg | ORAL_TABLET | Freq: Two times a day (BID) | ORAL | 0 refills | Status: AC

## 2024-10-05 MED ORDER — ORAL CARE MOUTH RINSE: 15.0000 mL | OROMUCOSAL | Status: DC | PRN

## 2024-10-05 MED ORDER — ACETAMINOPHEN 500 MG PO TABS: 1000.0000 mg | ORAL_TABLET | Freq: Three times a day (TID) | ORAL | 0 refills | Status: AC

## 2024-10-05 NOTE — Plan of Care (Signed)
  Problem: Pain Management: Goal: Pain level will decrease with appropriate interventions Outcome: Progressing   Problem: Activity: Goal: Ability to tolerate increased activity will improve Outcome: Progressing   Problem: Education: Goal: Knowledge of the prescribed therapeutic regimen will improve Outcome: Progressing   Problem: Safety: Goal: Ability to remain free from injury will improve Outcome: Progressing   Problem: Elimination: Goal: Will not experience complications related to urinary retention Outcome: Progressing   Problem: Coping: Goal: Level of anxiety will decrease Outcome: Progressing   Problem: Clinical Measurements: Goal: Ability to maintain clinical measurements within normal limits will improve Outcome: Progressing

## 2024-10-05 NOTE — Progress Notes (Signed)
 Patient discharged to home w/ family. Given all belongings, instructions, equipment. Patient verbalized understanding of sling/ immobilizer, drain care, follow up. Escorted to pov via w/c.

## 2024-10-05 NOTE — Discharge Instructions (Signed)
 Bonner Hair MD, MPH Aleck Stalling, PA-C [facility] [address] [phone] (tel)   [phone] (fax)   POST-OPERATIVE INSTRUCTIONS - TOTAL SHOULDER REPLACEMENT    WOUND CARE You may leave the operative dressing in place until your follow-up appointment. KEEP THE INCISIONS CLEAN AND DRY. There may be a small amount of fluid/bleeding leaking at the surgical site. This is normal after surgery.  If it fills with liquid or blood please call us  immediately to change it for you. Use the provided ice machine or Ice packs as often as possible for the first 3-4 days, then as needed for pain relief.   Keep a layer of cloth or a shirt between your skin and the cooling unit to prevent frost bite as it can get very cold.  SHOWERING: - You may take a bird bath on Post-Op Day #2.  - The dressing is water  resistant but do not scrub it as it may start to peel up.   - Gently pat the area dry.  - Do not soak the shoulder in water .  - Do not go swimming in the pool or ocean until your incision has completely healed (about 4-6 weeks after surgery) - KEEP THE INCISIONS CLEAN AND DRY.  EXERCISES Wear the sling at all times   It is normal for your fingers/hand to become more swollen after surgery and discolored from bruising.   This will resolve over the first few weeks usually after surgery. Please continue to ambulate and do not stay sitting or lying for too long.  Perform foot and wrist pumps to assist in circulation.  PHYSICAL THERAPY - No therapy for 6 weeks  REGIONAL ANESTHESIA (NERVE BLOCKS) The anesthesia team may have performed a nerve block for you this is a great tool used to minimize pain.   The block may start wearing off overnight (between 8-24 hours postop) When the block wears off, your pain may go from nearly zero to the pain you would have had postop without the block. This is an abrupt transition but nothing dangerous is happening.   This  can be a challenging period but utilize your as needed pain medications to try and manage this period. We suggest you use the pain medication the first night prior to going to bed, to ease this transition.  You may take an extra dose of narcotic when this happens if needed   POST-OP MEDICATIONS- Multimodal approach to pain control In general your pain will be controlled with a combination of substances.  Prescriptions unless otherwise discussed are electronically sent to your pharmacy.  This is a carefully made plan we use to minimize narcotic use.      Acetaminophen  - Non-narcotic pain medicine taken on a scheduled basis  Oxycodone  - This is a strong narcotic, to be used only on an "as needed" basis for SEVERE pain. Continue as prescribed by your pain management provider Amoxicillin  - this is an antibiotic, take as prescribed  - You may resume your blood thinner on Wednesday 10/06/24  FOLLOW-UP If you develop a Fever (>101.5), Redness or Drainage from the surgical incision site, please call our office to arrange for an evaluation. Please call the office to schedule a follow-up appointment for a wound check, 7-10 days post-operatively. We will call you to arrange follow up on Wednesday 11/26 for drain removal  IF YOU HAVE ANY QUESTIONS, PLEASE FEEL FREE TO CALL OUR OFFICE.  HELPFUL INFORMATION  Your arm will be in a  sling following surgery. You will be in this sling for the next 4-6 weeks.   You may be more comfortable sleeping in a semi-seated position the first few nights following surgery.  Keep a pillow propped under the elbow and forearm for comfort.  If you have a recliner type of chair it might be beneficial.  If not that is fine too, but it would be helpful to sleep propped up with pillows behind your operated shoulder as well under your elbow and forearm.  This will reduce pulling on the suture lines.  When dressing, put your operative arm in the sleeve first.  When getting  undressed, take your operative arm out last.  Loose fitting, button-down shirts are recommended.  Wear a shirt over your sling to prevent movement in your arm  In most states it is against the law to drive while your arm is in a sling. And certainly against the law to drive while taking narcotics.  You may return to work/school in the next couple of days when you feel up to it. Desk work and typing in the sling is fine.  We suggest you use the pain medication the first night prior to going to bed, in order to ease any pain when the anesthesia wears off. You should avoid taking pain medications on an empty stomach as it will make you nauseous.  You should wean off your narcotic medicines as soon as you are able.     Most patients will be off narcotics before their first postop appointment.   Do not drink alcoholic beverages or take illicit drugs when taking pain medications.  Pain medication may make you constipated.  Below are a few solutions to try in this order: Decrease the amount of pain medication if you aren't having pain. Drink lots of decaffeinated fluids. Drink prune juice and/or each dried prunes  If the first 3 don't work start with additional solutions Take Colace - an over-the-counter stool softener Take Senokot - an over-the-counter laxative Take Miralax  - a stronger over-the-counter laxative   Dental Antibiotics:  We require dental prophylaxis for 2 years after a shoulder replacement  Contact your surgeon for an antibiotic prescription, prior to your dental procedure.   For more information including helpful videos and documents visit our website:   [email or web address]

## 2024-10-05 NOTE — Discharge Summary (Signed)
 Patient ID: Roy Caldwell MRN: [id number] DOB/AGE: January 28, 1956 68 y.o.  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   Discharge Diagnoses:  Principal Problem:   Status post reverse total replacement of left shoulder   Past Medical History:  Diagnosis Date   Anemia    Aneurysm of left common iliac artery    Arthritis    CAD (coronary artery disease)    Cancer (HCC)    basal cell on scalp   Chronic kidney disease    COPD (chronic obstructive pulmonary disease) (HCC)    GERD (gastroesophageal reflux disease)    Hearing loss    History of kidney stones    HLD (hyperlipidemia)    Hypertension    Lumbar radiculopathy    Myocardial infarction (HCC)    Neuromuscular disorder (HCC)    radiculopathy, from back and neck   Peripheral vascular disease    Pneumonia    Stroke [facility]) 2021   TIA     Procedures Performed:  - Revision left reverse total Shoulder Arthroplasty  - Anterior capsulorrhaphy/reconstruction  - Open synovectomy  - Open treatment shoulder dislocation     Discharged Condition: stable  Hospital Course: Patient brought in as an outpatient for surgery.  Tolerated procedure well.  Was kept for monitoring overnight for pain control and medical monitoring postop and was found to be stable for DC home the morning after surgery.  Patient was instructed on specific activity restrictions and all questions were answered.   Opiates Today (MME): Today's  total administered Morphine Milligram Equivalents: 0 Opiates Yesterday (MME): Yesterday's total administered Morphine Milligram Equivalents: 45 Opiates Used in the last two days:  Inpatient Morphine Milligram Equivalents Per Day 11/24 - 11/25   Values displayed are in units of MME/Day    Order Start / End Date Yesterday Today    oxyCODONE  (Oxy IR/ROXICODONE ) immediate release tablet 5 mg 11/24 - 11/24 0 of Unknown --     oxyCODONE  (ROXICODONE ) 5 MG/5ML solution 5 mg 11/24 - 11/24 0 of Unknown --      Group total: 0 of Unknown     fentaNYL  (SUBLIMAZE ) injection 50-100 mcg 11/24 - 11/24 15 of 15-30 --    fentaNYL  (SUBLIMAZE ) 50 MCG/ML injection 11/24 - 11/24 0 of Unknown --    fentaNYL  (SUBLIMAZE ) injection 25-50 mcg 11/24 - 11/24 0 of 45-90 --    fentaNYL  (SUBLIMAZE ) injection 11/24 - 11/24 *30 of 30 --    HYDROmorphone  (DILAUDID ) injection 0.5-1 mg 11/24 - No end date 0 of 20-40 0 of 60-120    oxyCODONE  (Oxy IR/ROXICODONE ) immediate release tablet 5-10 mg 11/24 - No end date 0 of 15-30 0 of 45-90    oxyCODONE  (Oxy IR/ROXICODONE ) immediate release tablet 10-15 mg 11/24 - No end date 0 of 30-45 0 of 90-135    Daily Totals  * 45 of Unknown (at least 155-265) 0 of 195-345  *One-Step medication  Calculation Errors     Order Type Date Details   fentaNYL  (SUBLIMAZE ) 50 MCG/ML injection Ordered Dose -- Frequency type could not be determined   oxyCODONE  (Oxy IR/ROXICODONE ) immediate release tablet 5 mg Ordered Dose -- Insufficient frequency information   oxyCODONE  (ROXICODONE ) 5 MG/5ML solution 5 mg Ordered Dose -- Insufficient frequency information            Consults: None  Significant Diagnostic Studies: Cultures  Treatments: Surgery  Discharge Exam: General: sitting in hospital bed, NAD RUE: dressing CDI,  drain in place with about 25 cc of bloody drainage in the canister - this was recently emptied, Axillary nerve sensation/motor altered in setting of block and unable to be fully tested.  Distal motor and sensory altered in setting of block.    Disposition: Discharge disposition: 01-Home or Self Care       Discharge Instructions     Call MD for:  redness, tenderness, or signs of infection (pain, swelling, redness, odor or green/yellow discharge around incision site)   Complete by: As directed    Call MD for:  severe uncontrolled pain   Complete by: As directed    Call MD for:  temperature  >100.4   Complete by: As directed    Diet - low sodium heart healthy   Complete by: As directed       Allergies as of 10/05/2024       Reactions   Bee Venom Anaphylaxis, Hives   Shellfish Allergy Anaphylaxis, Hives, Dermatitis   Pt can tolerate shrimp   Statins    Tape Itching   Adhesive        Medication List     TAKE these medications    acetaminophen  500 MG tablet Commonly known as: TYLENOL  Take 2 tablets (1,000 mg total) by mouth every 8 (eight) hours for 14 days.   alfuzosin  10 MG 24 hr tablet Commonly known as: UROXATRAL  Take 10 mg by mouth daily.   amoxicillin  500 MG tablet Commonly known as: AMOXIL  Take 1 tablet (500 mg total) by mouth 2 (two) times daily for 14 days.   aspirin  EC 81 MG tablet Take 81 mg by mouth daily. Swallow whole.   cilostazol 50 MG tablet Commonly known as: PLETAL Take 50 mg by mouth 2 (two) times daily.   clopidogrel 75 MG tablet Commonly known as: PLAVIX Take 75 mg by mouth daily.   cyanocobalamin 1000 MCG tablet Take 1,000 mcg by mouth daily.   diclofenac  75 MG EC tablet Commonly known as: VOLTAREN  Take 1 tablet (75 mg total) by mouth 2 (two) times daily.   ezetimibe  10 MG tablet Commonly known as: ZETIA  Take 10 mg by mouth every evening.   finasteride  5 MG tablet Commonly known as: PROSCAR  Take 5 mg by mouth daily.   methocarbamol  500 MG tablet Commonly known as: ROBAXIN  Take 1 tablet (500 mg total) by mouth every 8 (eight) hours as needed for muscle spasms. What changed: Another medication with the same name was added. Make sure you understand how and when to take each.   methocarbamol  500 MG tablet Commonly known as: ROBAXIN  Take 1 tablet (500 mg total) by mouth every 8 (eight) hours as needed for muscle spasms. What changed: You were already taking a medication with the same name, and this prescription was added. Make sure you understand how and when to take each.   multivitamin with minerals Tabs  tablet Take 1 tablet by mouth daily. Centrum   nitroGLYCERIN 0.4 MG SL tablet Commonly known as: NITROSTAT Place 0.4 mg under the tongue every 5 (five) minutes as needed for chest pain.   omeprazole 20 MG capsule Commonly known as: PRILOSEC Take 40 mg by mouth in the morning.   oxyCODONE -acetaminophen  10-325 MG tablet Commonly known as: PERCOCET Take 1 tablet by mouth every 4 (four) hours as needed for pain. for pain   pravastatin  40 MG tablet Commonly known as: PRAVACHOL  Take 40 mg by mouth daily.   sildenafil 20 MG tablet Commonly known as: REVATIO Take 20  mg by mouth daily as needed (ED).   Vitamin D3 250 MCG (10000 UT) capsule Take 10,000 Units by mouth daily.           Aleck Stalling, PA-C 10/05/24

## 2024-10-05 NOTE — Op Note (Addendum)
 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  Patient unfortunately had another dislocation.  Conceptually his length was reasonable and at this point now almost 3 months from his index surgery he had reasonable healing of the scapula and thus I felt it was able to increase his tension through the implant.  We also clear that his soft tissue envelope around his implant was quite minimal after his multiple surgeries and dislocations.  Based on this upon examination his stem had spun to about 50 degrees of retroversion with his last dislocation.  We removed the stem and based on this and his multiple surgery worried about infection.  We took samples again that this the surgery to be held for C acnes.  His glenoid position and implant was reasonable.  Posterior capsule neurotomy and synovectomy was performed.  We replaced his glenoid implant and did a anterior capsular reconstruction with a Achilles graft and used a revive stem to control his version and obtained distal fit.  Final construct had great stability.  We did rotate the polyethylene to have the largest part of the jump slightly posterior.  Post-operative plan: The patient will be NWB in sling.  The patient will be will be admitted to observation due to medical complexity, monitoring and pain management.  DVT prophylaxis not indicated as patient already on full dose anticoagulant.  Pain control with PRN pain medication preferring oral medicines.  Follow up plan will be scheduled in approximately 7 days for incision check and XR.  Physical therapy to start after  6 weeks.  Implants: Tornier perform humeral revive size 15 stem with a 10 mm spacer, 0 high offset baseplate with a +9 retentive polyethylene, 42 standard glenosphere and a retained 25 mm baseplate.  Achilles allograft placed.  Post-Op Diagnosis: Same Surgeons:Primary: Cristy Bonner DASEN, MD Assistants:Caroline McBane, PA-C Location: [facility] ROOM 08 Anesthesia: General with Exparel  Interscalene Antibiotics: Ancef  2g preop, Vancomycin  1000mg  locally Tourniquet time: None Estimated Blood Loss: 100 Complications: None Specimens: 3 for culture hold for 2 weeks to rule out C acnes. Implants: Implant Name Type Inv. Item Serial No. Manufacturer Lot No. LRB No. Used Action  GRAFT ACHILLES CALC BNE BLCK - [id number] Bone Implant GRAFT ACHILLES CALC BNE BLCK [id number] [employer]  Left 1 Implanted  ANCHOR SUT FBRTK 2.6 SP #5 - [id number] Anchor ANCHOR SUT FBRTK 2.6 SP #5  [employer] [id number] Left 1 Implanted  ANCHOR SUT FBRTK 2.6 SP #5 - [id number] Anchor ANCHOR SUT FBRTK 2.6 SP #5  [employer] [id number] Left 1 Implanted  BASEPLATE GLENOID STD REV 42 - [id number] Joint BASEPLATE GLENOID STD REV 42 [id number] [employer]  Left 1 Implanted  SPACER SHLD CMT AEQ 15X20 - [id number] Shoulder SPACER SHLD CMT AEQ 15X20 [id number] [employer]  Left 1 Implanted  STEM PTC DISTAL 15X90 - [id number] Stem STEM PTC DISTAL 15X90 [id number] [employer]  Left 1 Implanted  IMPL PROX BODY PTC 15X132 - [id number] Stem IMPL PROX BODY PTC 15X132 [id number] [employer]  Left 1 Implanted  SCREW SHLD ASSEMBLY AEQ 20 - [id number] Screw SCREW SHLD ASSEMBLY AEQ 20 [id number] [employer]  Left 1 Implanted  CAP LOCKING COCR - [id number] Cap CAP LOCKING COCR  [id number] [employer]  Left 1 Implanted  IMPL REVERSE SHOULDER 0X3.5 - [id number] Shoulder IMPL REVERSE SHOULDER 0X3.5 [id number] [employer]  Left 1 Implanted  RETENTIVE REVERSED INSERT 42 +9 POLY Shoulder  [id number] [employer]  Left 1 Implanted     Indications for Surgery:   Roy Caldwell is a 68 y.o. male with previous shoulder instability and a previous revision unfortunately the patient had another instability event..  Benefits and risks of operative and nonoperative management were discussed prior to surgery with patient/guardian(s) and informed consent form was completed.  Infection and need for further surgery were discussed as was prosthetic stability and cuff issues.  We additionally specifically discussed risks of axillary nerve injury, infection, periprosthetic fracture, continued pain and longevity of implants prior to beginning procedure.      Procedure:   The patient was identified in the preoperative holding area where the surgical site was marked. Block placed by anesthesia with exparel .  The patient was taken to the OR where a procedural timeout was called and the above noted anesthesia was induced.  The patient was positioned beachchair on allen table with spider arm positioner.  Preoperative antibiotics were dosed.  The patient's left shoulder was prepped and draped in the usual sterile fashion.  A second preoperative timeout was called.      Shoulder was unable to be reduced closed.  We began with the previous deltopectoral approach going through skin sharply achieving hemostasis we progressed.  Identified the remnant of the deltopectoral interval and bluntly dissected through this interval.  We were able to expose the implant which was anteriorly dislocated and there is a large seroma.  There is significant contracture of the anterior musculature including the pectoralis.  There is clear that the tension of the shoulder wanted to sit anterior.  We able to open reduce the joint and the tension was slightly less than we would want in the setting of a now healed scapula.  There was reasonable tension in the setting of continued instability over the left shoulder to be tighter ideally however previous surgeries were trying  to respect his scapular fracture as well.  I then remove the spacer and polyethylene.  It was clear at that point after putting a broach handle on the prior stem that the stem had rotated to at least 50 to 60 degrees of retroversion.  Upon twisting the broach handle the stem was not fully ingrown.  I was able to remove the stem at this point without issue.  We then used a curette to clear the native canal down to bleeding bony surface.  I used the revive system to trial and select a 15 stem with a 10 mm spacer and 15 proximal body.  This recreated good tension overall though we were going to size for the polyethylene later.  At this point I turned my attention to glenoid.  The glenoid implant was removed in the form the glenosphere.  I cleared the anterior glenoid for visualization.  Purposes.  Performed a synovectomy and took multiple specimens.  We then started our capsular reconstruction.  Using Achilles allograft that was frozen.  We then placed two 2.6 fiber tack anchors after removing one of the peripheral screws from the baseplate.  Stable to place an anchor in the anterior glenoid as well as in the coracoacromial arch.  This repaired the labrum to the anterior glenoid.  At that point these were secured with a knotless mechanism from the 2 6 anchors.  We then irrigated copiously and placed a new 42 mm glenosphere.    Turned our attention back to the humerus.  The final implants were assembled on the back table to specification.  We placed 4 bone tunnels in the shaft and proximal humerus and were able to pass #5 FiberWire #2 suture tape through these.   At this point we are able to place our final implants after irrigating copiously and the canal with our sutures passed around the stem to act as an internal washer and prevent pull through the bone.  The sutures did slide for eventual knot-tying.  Was able to reduce the joint successfully with good tension and we did spun the polyethylene of the  retentive polyethylene slightly posterior on the tray to try and encourage the implant to not posteriorly dislocate.  It was only rotated by a few degrees.  It was a relatively tight reduction and if any tighter would have been near impossible to actually reduce.  Once reduced we took the shoulder through range of motion and noted that there was no impingement no sign of dislocation.  At this point I placed the shoulder in 10 to 15 degrees of abduction, 10 degrees of forward flexion and 10 to 15 degrees of external rotation.  In this position I took my sutures that were placed around the stem x 3 and anchored the graft and full tension at this level of range of motion to the anterior portion of the humerus.  This constituted a double row repair.  This recreated and anterior-inferior capsule and was under good tension at the end of the case.  Shoulder was again taken through range of motion without any sign of instability.  We placed a medium Hemovac and then irrigated copiously.  Local vancomycin  powder was placed.  We closed incision in a multi fashion finishing with nylon suture.  Sterile dressing was placed.  Sling was placed with a pillow.  Patient awoken and taken to PACU in stable condition.   Aleck Stalling, PA-C, present and scrubbed throughout the case, critical for completion in a timely fashion, and for retraction, instrumentation, closure.

## 2024-10-05 NOTE — TOC Transition Note (Signed)
 Transition of Care [facility]) - Discharge Note   Patient Details  Name: Roy Caldwell MRN: [id number] Date of Birth: April 25, 1956  Transition of [facility]) CM/SW Contact:  Alfonse JONELLE Rex, RN Phone Number: 10/05/2024, 10:09 AM   Clinical Narrative:   Admitted 10/04/24 for scheduled reverse total replacement of left shoulder , instructed on instructed on specific activity restrictions by attending, dc home with sling and hemovac. No INPT CM needs           Patient Goals and CMS Choice            Discharge Placement                       Discharge Plan and Services Additional resources added to the After Visit Summary for                                       Social Drivers of Health (SDOH) Interventions SDOH Screenings   Food Insecurity: No Food Insecurity (10/04/2024)  Housing: Low Risk  (10/04/2024)  Transportation Needs: No Transportation Needs (10/04/2024)  Utilities: Not At Risk (10/04/2024)  Social Connections: Socially Integrated (10/04/2024)  Tobacco Use: Medium Risk (10/04/2024)     Readmission Risk Interventions    07/08/2024   10:36 AM  Readmission Risk Prevention Plan  Post Dischage Appt Complete  Medication Screening Complete  Transportation Screening Complete

## 2024-10-05 NOTE — Progress Notes (Signed)
   ORTHOPAEDIC PROGRESS NOTE  s/p Procedure(s): REVISION, REVERSE TOTAL ARTHROPLASTY, SHOULDER SYNOVECTOMY REPAIR, ROTATOR CUFF, OPEN  SUBJECTIVE: Reports mild pain about operative site.   No chest pain. No SOB. No nausea/vomiting. No other complaints.  OBJECTIVE: PE: General: sitting in hospital bed, NAD RUE: dressing CDI, drain in place with about 25 cc of bloody drainage in the canister - this was recently emptied, Axillary nerve sensation/motor altered in setting of block and unable to be fully tested.  Distal motor and sensory altered in setting of block.   Vitals:   10/05/24 0155 10/05/24 0546  BP: 126/60 134/62  Pulse: 70 61  Resp: 18 18  Temp: 98.2 F (36.8 C) 98.3 F (36.8 C)  SpO2: 97% 99%    Opiates Today (MME): Today's  total administered Morphine Milligram Equivalents: 0 Opiates Yesterday (MME): Yesterday's total administered Morphine Milligram Equivalents: 45 Opiates Used in the last two days:  Inpatient Morphine Milligram Equivalents Per Day 11/24 - 11/25   Values displayed are in units of MME/Day    Order Start / End Date Yesterday Today    oxyCODONE  (Oxy IR/ROXICODONE ) immediate release tablet 5 mg 11/24 - 11/24 0 of Unknown --    oxyCODONE  (ROXICODONE ) 5 MG/5ML solution 5 mg 11/24 - 11/24 0 of Unknown --      Group total: 0 of Unknown     fentaNYL  (SUBLIMAZE ) injection 50-100 mcg 11/24 - 11/24 15 of 15-30 --    fentaNYL  (SUBLIMAZE ) 50 MCG/ML injection 11/24 - 11/24 0 of Unknown --    fentaNYL  (SUBLIMAZE ) injection 25-50 mcg 11/24 - 11/24 0 of 45-90 --    fentaNYL  (SUBLIMAZE ) injection 11/24 - 11/24 *30 of 30 --    HYDROmorphone  (DILAUDID ) injection 0.5-1 mg 11/24 - No end date 0 of 20-40 0 of 60-120    oxyCODONE  (Oxy IR/ROXICODONE ) immediate release tablet 5-10 mg 11/24 - No end date 0 of 15-30 0 of 45-90    oxyCODONE  (Oxy IR/ROXICODONE ) immediate release tablet 10-15 mg 11/24 - No end date 0 of 30-45 0 of 90-135    Daily Totals  * 45 of Unknown (at  least 155-265) 0 of 195-345  *One-Step medication  Calculation Errors     Order Type Date Details   fentaNYL  (SUBLIMAZE ) 50 MCG/ML injection Ordered Dose -- Frequency type could not be determined   oxyCODONE  (Oxy IR/ROXICODONE ) immediate release tablet 5 mg Ordered Dose -- Insufficient frequency information   oxyCODONE  (ROXICODONE ) 5 MG/5ML solution 5 mg Ordered Dose -- Insufficient frequency information           Stable post-op images  ASSESSMENT: Roy Caldwell is a 69 y.o. male doing well postoperatively.  PLAN: Weightbearing: NWB LUE Insicional and dressing care: Reinforce dressings as needed Orthopedic device(s): Sling and hemovac Showering: hold for now VTE prophylaxis: Resume home meds tomorrow Pain control: PRN pain medication, minimize narcotics as able Follow - up plan: Tomorrow in office for drain removal  Contact information:  Weekdays 8-5 Dr. Bonner Hair, Aleck Stalling PA-C, After hours and holidays please check [email or web address] for group call information for [facility]  Aleck Stalling, PA-C 10/05/24

## 2024-10-06 ENCOUNTER — Encounter (HOSPITAL_COMMUNITY): Payer: Self-pay | Admitting: Orthopaedic Surgery

## 2024-10-18 LAB — AEROBIC/ANAEROBIC CULTURE W GRAM STAIN (SURGICAL/DEEP WOUND)
Culture: NO GROWTH
Culture: NO GROWTH
Culture: NO GROWTH

## 2024-11-23 ENCOUNTER — Encounter (HOSPITAL_BASED_OUTPATIENT_CLINIC_OR_DEPARTMENT_OTHER): Payer: Self-pay

## 2024-11-23 ENCOUNTER — Other Ambulatory Visit: Payer: Self-pay

## 2024-11-23 ENCOUNTER — Emergency Department (HOSPITAL_BASED_OUTPATIENT_CLINIC_OR_DEPARTMENT_OTHER)

## 2024-11-23 ENCOUNTER — Emergency Department (HOSPITAL_BASED_OUTPATIENT_CLINIC_OR_DEPARTMENT_OTHER)
Admission: EM | Admit: 2024-11-23 | Discharge: 2024-11-23 | Disposition: A | Discharge status: Home / Self Care | Attending: Emergency Medicine | Admitting: Emergency Medicine

## 2024-11-23 DIAGNOSIS — L509 Urticaria, unspecified: Secondary | ICD-10-CM | POA: Insufficient documentation

## 2024-11-23 DIAGNOSIS — I129 Hypertensive chronic kidney disease with stage 1 through stage 4 chronic kidney disease, or unspecified chronic kidney disease: Secondary | ICD-10-CM | POA: Insufficient documentation

## 2024-11-23 DIAGNOSIS — J449 Chronic obstructive pulmonary disease, unspecified: Secondary | ICD-10-CM | POA: Insufficient documentation

## 2024-11-23 DIAGNOSIS — N189 Chronic kidney disease, unspecified: Secondary | ICD-10-CM | POA: Insufficient documentation

## 2024-11-23 DIAGNOSIS — Z85828 Personal history of other malignant neoplasm of skin: Secondary | ICD-10-CM | POA: Diagnosis not present

## 2024-11-23 DIAGNOSIS — I2510 Atherosclerotic heart disease of native coronary artery without angina pectoris: Secondary | ICD-10-CM | POA: Insufficient documentation

## 2024-11-23 DIAGNOSIS — R0602 Shortness of breath: Secondary | ICD-10-CM | POA: Diagnosis present

## 2024-11-23 DIAGNOSIS — R21 Rash and other nonspecific skin eruption: Secondary | ICD-10-CM | POA: Diagnosis present

## 2024-11-23 LAB — RESP PANEL BY RT-PCR (RSV, FLU A&B, COVID)  RVPGX2
Influenza A by PCR: NEGATIVE
Influenza B by PCR: NEGATIVE
Resp Syncytial Virus by PCR: NEGATIVE
SARS Coronavirus 2 by RT PCR: NEGATIVE

## 2024-11-23 LAB — CBC WITH DIFFERENTIAL/PLATELET
Abs Immature Granulocytes: 0.08 K/uL — ABNORMAL HIGH (ref 0.00–0.07)
Basophils Absolute: 0 K/uL (ref 0.0–0.1)
Basophils Relative: 0 %
Eosinophils Absolute: 0.1 K/uL (ref 0.0–0.5)
Eosinophils Relative: 0 %
HCT: 32.2 % — ABNORMAL LOW (ref 39.0–52.0)
Hemoglobin: 10.3 g/dL — ABNORMAL LOW (ref 13.0–17.0)
Immature Granulocytes: 1 %
Lymphocytes Relative: 5 %
Lymphs Abs: 0.8 K/uL (ref 0.7–4.0)
MCH: 27.3 pg (ref 26.0–34.0)
MCHC: 32 g/dL (ref 30.0–36.0)
MCV: 85.4 fL (ref 80.0–100.0)
Monocytes Absolute: 0.6 K/uL (ref 0.1–1.0)
Monocytes Relative: 4 %
Neutro Abs: 13.6 K/uL — ABNORMAL HIGH (ref 1.7–7.7)
Neutrophils Relative %: 90 %
Platelets: 290 K/uL (ref 150–400)
RBC: 3.77 MIL/uL — ABNORMAL LOW (ref 4.22–5.81)
RDW: 14.7 % (ref 11.5–15.5)
WBC: 15.1 K/uL — ABNORMAL HIGH (ref 4.0–10.5)
nRBC: 0 % (ref 0.0–0.2)

## 2024-11-23 LAB — COMPREHENSIVE METABOLIC PANEL WITH GFR
ALT: 14 U/L (ref 0–44)
AST: 17 U/L (ref 15–41)
Albumin: 4.1 g/dL (ref 3.5–5.0)
Alkaline Phosphatase: 94 U/L (ref 38–126)
Anion gap: 14 (ref 5–15)
BUN: 23 mg/dL (ref 8–23)
CO2: 22 mmol/L (ref 22–32)
Calcium: 8.5 mg/dL — ABNORMAL LOW (ref 8.9–10.3)
Chloride: 100 mmol/L (ref 98–111)
Creatinine, Ser: 0.88 mg/dL (ref 0.61–1.24)
GFR, Estimated: >60 mL/min
Glucose, Bld: 112 mg/dL — ABNORMAL HIGH (ref 70–99)
Potassium: 3.8 mmol/L (ref 3.5–5.1)
Sodium: 135 mmol/L (ref 135–145)
Total Bilirubin: 0.6 mg/dL (ref 0.0–1.2)
Total Protein: 6.5 g/dL (ref 6.5–8.1)

## 2024-11-23 LAB — LIPASE, BLOOD: Lipase: 19 U/L (ref 11–51)

## 2024-11-23 MED ORDER — FAMOTIDINE IN NACL 20-0.9 MG/50ML-% IV SOLN
20.0000 mg | Freq: Once | INTRAVENOUS | Status: AC
  Administered 2024-11-23: 20 mg via INTRAVENOUS
  Filled 2024-11-23: qty 50

## 2024-11-23 MED ORDER — SODIUM CHLORIDE 0.9 % IV BOLUS
500.0000 mL | Freq: Once | INTRAVENOUS | Status: AC
  Administered 2024-11-23: 500 mL via INTRAVENOUS

## 2024-11-23 MED ORDER — PREDNISONE 20 MG PO TABS: 20.0000 mg | ORAL_TABLET | Freq: Every day | ORAL | 0 refills | Status: AC

## 2024-11-23 MED ORDER — METHYLPREDNISOLONE SODIUM SUCC 125 MG IJ SOLR
125.0000 mg | Freq: Once | INTRAMUSCULAR | Status: AC
  Administered 2024-11-23: 125 mg via INTRAVENOUS
  Filled 2024-11-23: qty 2

## 2024-11-23 MED ORDER — DIPHENHYDRAMINE HCL 50 MG/ML IJ SOLN
25.0000 mg | Freq: Once | INTRAMUSCULAR | Status: AC
  Administered 2024-11-23: 25 mg via INTRAVENOUS
  Filled 2024-11-23: qty 1

## 2024-11-23 MED ORDER — EPINEPHRINE 0.3 MG/0.3ML IJ SOAJ: 0.3000 mg | INTRAMUSCULAR | 2 refills | Status: AC | PRN

## 2024-11-23 NOTE — Discharge Instructions (Signed)
You have been seen in the Emergency Department (ED) today for an allergic reaction.  You have been stable throughout your stay in the Emergency Department.  Please take your medications as prescribed and follow up with your doctor as indicated.  You should also take over-the-counter Benadryl around the clock for the next three days according to the dosing instructions on the package.  Please keep your Epi-Pen with you at all times and use it if experience shortness of breath or difficulty breathing or if you believe you are having a severe allergic reaction.  If you use the Epi-Pen, though, please call 911 afterwards or go immediately to your nearest Emergency Department.  Return to the Emergency Department (ED) if you experience any worsening or new symptoms that concern you.  

## 2024-11-23 NOTE — ED Triage Notes (Signed)
 Reports generalized itchy rash, difficulty breathing, dizziness since last night  No obvious signs of respiratory distress in triage   EDP in triage

## 2024-11-23 NOTE — ED Provider Notes (Signed)
 "  Emergency Department Provider Note   I have reviewed the triage vital signs and the nursing notes.   HISTORY  Chief Complaint Allergic Reaction   HPI Roy Caldwell is a 69 y.o. male presents to the ED with itchy red rash and mild SOB. Patient has been having symptoms since last night. No CP. No fever or cough. No new medications. He has an allergy to shellfish but no known exposure. He was doing some plumbing on a house recently with rat droppings.  Past Medical History:  Diagnosis Date   Anemia    Aneurysm of left common iliac artery    Arthritis    CAD (coronary artery disease)    Cancer (HCC)    basal cell on scalp   Chronic kidney disease    COPD (chronic obstructive pulmonary disease) (HCC)    GERD (gastroesophageal reflux disease)    Hearing loss    History of kidney stones    HLD (hyperlipidemia)    Hypertension    Lumbar radiculopathy    Myocardial infarction [facility])    Neuromuscular disorder (HCC)    radiculopathy, from back and neck   Peripheral vascular disease    Pneumonia    Stroke (HCC) 2021   TIA    Review of Systems  Constitutional: No fever/chills Eyes: No visual changes. ENT: No sore throat. Cardiovascular: Denies chest pain. Respiratory: Positive shortness of breath. Gastrointestinal: No abdominal pain.  No nausea, no vomiting.   Skin: Positive rash.  Neurological: Negative for headaches.  ____________________________________________   PHYSICAL EXAM:  VITAL SIGNS: ED Triage Vitals [11/23/24 1749]  Encounter Vitals Group     BP (!) 121/51     Pulse Rate 94     Resp 18     Temp 99.6 F (37.6 C)     Temp Source Oral     SpO2 97 %   Constitutional: Alert and oriented. Well appearing and in no acute distress. Eyes: Conjunctivae are normal.  Head: Atraumatic. Nose: No congestion/rhinnorhea. Mouth/Throat: Mucous membranes are moist.   Neck: No stridor. Cardiovascular: Normal rate, regular rhythm. Good peripheral circulation.  Grossly normal heart sounds.   Respiratory: Normal respiratory effort.  No retractions. Lungs CTAB. Gastrointestinal: Soft and nontender. No distention.  Musculoskeletal:No gross deformities of extremities. Neurologic:  Normal speech and language.  Skin: Urticarial rash to the trunk and lower extremities.  ____________________________________________   LABS (all labs ordered are listed, but only abnormal results are displayed)  Labs Reviewed  COMPREHENSIVE METABOLIC PANEL WITH GFR - Abnormal; Notable for the following components:      Result Value   Glucose, Bld 112 (*)    Calcium 8.5 (*)    All other components within normal limits  CBC WITH DIFFERENTIAL/PLATELET - Abnormal; Notable for the following components:   WBC 15.1 (*)    RBC 3.77 (*)    Hemoglobin 10.3 (*)    HCT 32.2 (*)    Neutro Abs 13.6 (*)    Abs Immature Granulocytes 0.08 (*)    All other components within normal limits  RESP PANEL BY RT-PCR (RSV, FLU A&B, COVID)  RVPGX2  LIPASE, BLOOD    ____________________________________________   PROCEDURES  Procedure(s) performed:   Procedures  None  ____________________________________________   INITIAL IMPRESSION / ASSESSMENT AND PLAN / ED COURSE  Pertinent labs & imaging results that were available during my care of the patient were reviewed by me and considered in my medical decision making (see chart for details).   This  patient is Presenting for Evaluation of rash, which does require a range of treatment options, and is a complaint that involves a high risk of morbidity and mortality.  The Differential Diagnoses include allergic reaction, anaphylaxis, DRES, TEN/SJS, etc.  Critical Interventions-    Medications  sodium chloride  0.9 % bolus 500 mL (0 mLs Intravenous Stopped 11/23/24 2014)  famotidine  (PEPCID ) IVPB 20 mg premix (0 mg Intravenous Stopped 11/23/24 1851)  methylPREDNISolone  sodium succinate (SOLU-MEDROL ) 125 mg/2 mL injection 125 mg (125 mg  Intravenous Given 11/23/24 1817)  diphenhydrAMINE  (BENADRYL ) injection 25 mg (25 mg Intravenous Given 11/23/24 1816)    Reassessment after intervention:  rash and other symptoms improved.    I did obtain Additional Historical Information from wife at bedside.   Clinical Laboratory Tests Ordered, included CBC with leukocytosis. No AKI. Lipase negative.   Radiologic Tests Ordered, included CXR. I independently interpreted the images and agree with radiology interpretation.   Cardiac Monitor Tracing which shows NSR.    Social Determinants of Health Risk patient is not an active smoker.   Medical Decision Making: Summary:  The patient presents to the emergency department with rash which is itchy and appears to be urticarial.  No known exposure to new medication or known allergy trigger.  No airway involvement, hypotension, other findings to strongly suspect anaphylaxis.  Plan for steroid, Benadryl , Pepcid  and screening blood work with ED monitoring.  Reevaluation with update and discussion with patient. Rash and other symptoms resolving. Stable for discharge. EpiPen  Rx sent to pharmacy as well.    Patient's presentation is most consistent with acute presentation with potential threat to life or bodily function.   Disposition: discharge  ____________________________________________  FINAL CLINICAL IMPRESSION(S) / ED DIAGNOSES  Final diagnoses:  Urticaria     NEW OUTPATIENT MEDICATIONS STARTED DURING THIS VISIT:  Discharge Medication List as of 11/23/2024  8:08 PM     START taking these medications   Details  EPINEPHrine  0.3 mg/0.3 mL IJ SOAJ injection Inject 0.3 mg into the muscle as needed for anaphylaxis., Starting Tue 11/23/2024, Normal    predniSONE  (DELTASONE ) 20 MG tablet Take 1 tablet (20 mg total) by mouth daily for 4 days., Starting Tue 11/23/2024, Until Sat 11/27/2024, Normal        Note:  This document was prepared using Dragon voice recognition software and may  include unintentional dictation errors.  Fonda Law, MD, [facility] Emergency Medicine    Mickael Mcnutt, Fonda MATSU, MD 11/29/24 [phone]  "

## 2024-11-23 NOTE — ED Notes (Signed)

## 2024-11-27 ENCOUNTER — Encounter (HOSPITAL_BASED_OUTPATIENT_CLINIC_OR_DEPARTMENT_OTHER): Payer: Self-pay | Admitting: Emergency Medicine

## 2024-11-27 ENCOUNTER — Other Ambulatory Visit: Payer: Self-pay

## 2024-11-27 ENCOUNTER — Emergency Department (HOSPITAL_BASED_OUTPATIENT_CLINIC_OR_DEPARTMENT_OTHER)
Admission: EM | Admit: 2024-11-27 | Discharge: 2024-11-27 | Disposition: A | Discharge status: Home / Self Care | Attending: Emergency Medicine | Admitting: Emergency Medicine

## 2024-11-27 DIAGNOSIS — R21 Rash and other nonspecific skin eruption: Secondary | ICD-10-CM | POA: Diagnosis present

## 2024-11-27 DIAGNOSIS — I2510 Atherosclerotic heart disease of native coronary artery without angina pectoris: Secondary | ICD-10-CM | POA: Diagnosis not present

## 2024-11-27 DIAGNOSIS — N189 Chronic kidney disease, unspecified: Secondary | ICD-10-CM | POA: Diagnosis not present

## 2024-11-27 DIAGNOSIS — Z7901 Long term (current) use of anticoagulants: Secondary | ICD-10-CM | POA: Insufficient documentation

## 2024-11-27 DIAGNOSIS — L509 Urticaria, unspecified: Secondary | ICD-10-CM | POA: Diagnosis not present

## 2024-11-27 DIAGNOSIS — Z7982 Long term (current) use of aspirin: Secondary | ICD-10-CM | POA: Diagnosis not present

## 2024-11-27 DIAGNOSIS — Z87442 Personal history of urinary calculi: Secondary | ICD-10-CM | POA: Insufficient documentation

## 2024-11-27 DIAGNOSIS — J449 Chronic obstructive pulmonary disease, unspecified: Secondary | ICD-10-CM | POA: Diagnosis not present

## 2024-11-27 DIAGNOSIS — I129 Hypertensive chronic kidney disease with stage 1 through stage 4 chronic kidney disease, or unspecified chronic kidney disease: Secondary | ICD-10-CM | POA: Insufficient documentation

## 2024-11-27 DIAGNOSIS — Z8673 Personal history of transient ischemic attack (TIA), and cerebral infarction without residual deficits: Secondary | ICD-10-CM | POA: Diagnosis not present

## 2024-11-27 MED ORDER — FAMOTIDINE IN NACL 20-0.9 MG/50ML-% IV SOLN
20.0000 mg | Freq: Once | INTRAVENOUS | Status: AC
  Administered 2024-11-27: 20 mg via INTRAVENOUS
  Filled 2024-11-27: qty 50

## 2024-11-27 MED ORDER — DEXAMETHASONE SOD PHOSPHATE PF 10 MG/ML IJ SOLN
10.0000 mg | Freq: Once | INTRAMUSCULAR | Status: AC
  Administered 2024-11-27: 10 mg via INTRAVENOUS
  Filled 2024-11-27: qty 1

## 2024-11-27 MED ORDER — FENTANYL CITRATE (PF) 50 MCG/ML IJ SOSY
50.0000 ug | PREFILLED_SYRINGE | Freq: Once | INTRAMUSCULAR | Status: AC
  Administered 2024-11-27: 50 ug via INTRAVENOUS
  Filled 2024-11-27: qty 1

## 2024-11-27 MED ORDER — SODIUM CHLORIDE 0.9 % IV BOLUS
500.0000 mL | Freq: Once | INTRAVENOUS | Status: AC
  Administered 2024-11-27: 500 mL via INTRAVENOUS

## 2024-11-27 MED ORDER — CETIRIZINE HCL 10 MG PO TABS: 10.0000 mg | ORAL_TABLET | Freq: Every day | ORAL | 0 refills | Status: DC

## 2024-11-27 MED ORDER — DIPHENHYDRAMINE HCL 50 MG/ML IJ SOLN
25.0000 mg | Freq: Once | INTRAMUSCULAR | Status: AC
  Administered 2024-11-27: 25 mg via INTRAVENOUS
  Filled 2024-11-27: qty 1

## 2024-11-27 MED ORDER — FAMOTIDINE 20 MG PO TABS: 20.0000 mg | ORAL_TABLET | Freq: Two times a day (BID) | ORAL | 0 refills | Status: AC

## 2024-11-27 NOTE — ED Provider Notes (Signed)
 " [facility] Caldwell [facility] AT [facility] Provider Note   CSN: [id number] Arrival date & time: 11/27/24  1409     Patient presents with: Urticaria (Hives) and Shoulder Pain   Roy Caldwell is a 69 y.o. male.   Patient is a 69 year old male with a history of coronary artery disease, hypertension, chronic kidney disease, left iliac artery aneurysm, COPD who presents with a rash.  He has had a rash for about the last 2 to 3 days.  He is not sure what triggered it.  He was seen here 2 days ago.  At that time he also had some shortness of breath.  He was treated for anaphylaxis.  He was given a prescription for prednisone  to use.  He said he cannot tolerate the prednisone  because it makes him feel ornery.  Makes him feel very jittery as well.  He says the rash came back today.  He has been taking Benadryl  at home.  He gave himself an injection of EpiPen  this morning due to the rash.  He denies any definite airway swelling.  He says his lips feel tingly but he feels tingly all over and not really any more in his lips.  He has some intermittent shortness of breath at baseline but no more today.  He does not know what triggered the rash.  He has had a history of similar type allergic reactions in the past.  He does have a shellfish allergy.  He request the same medications that he had last time which included steroids, Pepcid  and Benadryl .  He says it helped him to feel a lot better.  I did advise him that the IV steroids can have the same type of side effects of the prednisone  orally does but he wants to go ahead and get the IV steroid because he thinks it does not affect him as much.  He also has pain in his left shoulder.  This has been going on about 2 weeks.  He has had recurrent problems with his left shoulder.  He has had prior dislocations and prior surgical repair of his shoulder.  He is followed by Dr. Cristy.  He states he saw Dr. Cristy on Tuesday of this week and had an  x-ray which she said looked okay.  He does not report that Dr. Cristy advised him on how he was going to move forward with the shoulder pain treatment.  He denies any new injuries or change in symptoms from when he saw Dr. Cristy on Tuesday, he just has ongoing pain in his shoulder.  It is worse with any movements of his shoulder.  He has chronic numbness in his left hand which is unchanged from his baseline.  He has some chronic swelling of his left hand which per his wife looks unchanged.  He says he has had prior wrist surgery as well.  Sometimes he will drop things in his left hand but he says this is baseline for him as well.       Prior to Admission medications  Medication Sig Start Date End Date Taking? Authorizing Provider  cetirizine  (ZYRTEC  ALLERGY) 10 MG tablet Take 1 tablet (10 mg total) by mouth daily. 11/27/24  Yes Lenor Hollering, MD  famotidine  (PEPCID ) 20 MG tablet Take 1 tablet (20 mg total) by mouth 2 (two) times daily. 11/27/24  Yes Lenor Hollering, MD  alfuzosin  (UROXATRAL ) 10 MG 24 hr tablet Take 10 mg by mouth daily.    [provider]  aspirin  EC 81 MG tablet Take 81 mg by mouth daily. Swallow whole.    [provider]  Cholecalciferol (VITAMIN D3) 250 MCG (10000 UT) capsule Take 10,000 Units by mouth daily.    [provider]  cilostazol  (PLETAL ) 50 MG tablet Take 50 mg by mouth 2 (two) times daily. 04/07/24 04/07/25  [provider]  clopidogrel  (PLAVIX ) 75 MG tablet Take 75 mg by mouth daily.    [provider]  cyanocobalamin 1000 MCG tablet Take 1,000 mcg by mouth daily.    [provider]  diclofenac  (VOLTAREN ) 75 MG EC tablet Take 1 tablet (75 mg total) by mouth 2 (two) times daily. 07/27/24   McBane, Aleck SAILOR, PA-C  EPINEPHrine  0.3 mg/0.3 mL IJ SOAJ injection Inject 0.3 mg into the muscle as needed for anaphylaxis. 11/23/24   Long, Fonda MATSU, MD  ezetimibe  (ZETIA ) 10 MG tablet Take 10 mg by mouth every evening.     [provider]  finasteride  (PROSCAR ) 5 MG tablet Take 5 mg by mouth daily.    [provider]  methocarbamol  (ROBAXIN ) 500 MG tablet Take 1 tablet (500 mg total) by mouth every 8 (eight) hours as needed for muscle spasms. 07/27/24   McBane, Caroline N, PA-C  methocarbamol  (ROBAXIN ) 500 MG tablet Take 1 tablet (500 mg total) by mouth every 8 (eight) hours as needed for muscle spasms. 10/05/24   McBane, Caroline N, PA-C  Multiple Vitamin (MULTIVITAMIN WITH MINERALS) TABS tablet Take 1 tablet by mouth daily. Centrum    [provider]  nitroGLYCERIN  (NITROSTAT ) 0.4 MG SL tablet Place 0.4 mg under the tongue every 5 (five) minutes as needed for chest pain.    [provider]  omeprazole (PRILOSEC) 20 MG capsule Take 40 mg by mouth in the morning. 09/17/17   [provider]  oxyCODONE -acetaminophen  (PERCOCET) 10-325 MG tablet Take 1 tablet by mouth every 4 (four) hours as needed for pain. for pain    [provider]  pravastatin  (PRAVACHOL ) 40 MG tablet Take 40 mg by mouth daily.    [provider]  predniSONE  (DELTASONE ) 20 MG tablet Take 1 tablet (20 mg total) by mouth daily for 4 days. 11/23/24 11/27/24  Long, Joshua G, MD  sildenafil (REVATIO) 20 MG tablet Take 20 mg by mouth daily as needed (ED).    [provider]    Allergies: Bee venom, Shellfish allergy, Statins, and Tape    Review of Systems  Constitutional:  Negative for chills, diaphoresis, fatigue and fever.  HENT:  Negative for congestion, rhinorrhea and sneezing.   Eyes: Negative.   Respiratory:  Negative for cough, chest tightness and shortness of breath.   Cardiovascular:  Negative for chest pain and leg swelling.  Gastrointestinal:  Negative for abdominal pain, diarrhea, nausea and vomiting.  Genitourinary:  Negative for difficulty urinating, flank pain and frequency.  Musculoskeletal:  Positive for arthralgias. Negative for back pain.  Skin:  Positive for  rash.  Neurological:  Positive for weakness (Chronic weakness of left hand) and numbness (Paresthesias). Negative for dizziness, speech difficulty and headaches.    Updated Vital Signs BP (!) 140/58 (BP Location: Right Arm)   Pulse 93   Temp 97.8 F (36.6 C) (Oral)   Resp 20   Ht 5' 6 (1.676 m)   Wt 90.7 kg   SpO2 96%   BMI 32.28 kg/m   Physical Exam Constitutional:      Appearance: He is well-developed.  HENT:  Head: Normocephalic and atraumatic.  Eyes:     Pupils: Pupils are equal, round, and reactive to light.  Cardiovascular:     Rate and Rhythm: Normal rate and regular rhythm.     Heart sounds: Normal heart sounds.  Pulmonary:     Effort: Pulmonary effort is normal. No respiratory distress.     Breath sounds: Normal breath sounds. No wheezing or rales.  Chest:     Chest wall: No tenderness.  Abdominal:     General: Bowel sounds are normal.     Palpations: Abdomen is soft.     Tenderness: There is no abdominal tenderness. There is no guarding or rebound.  Musculoskeletal:        General: Normal range of motion.     Cervical back: Normal range of motion and neck supple.     Comments: Positive tenderness with palpation and range of motion of the left shoulder.  There is scarring from prior surgery.  No significant swelling of the shoulder or elbow.  There are some mild swelling of the left hand which per wife is baseline for him.  No warmth or erythema.  He has normal sensation and motor function in the hand.  Radial pulses intact.  Lymphadenopathy:     Cervical: No cervical adenopathy.  Skin:    General: Skin is warm and dry.     Findings: Rash (Positive raised, erythematous, splotchy rash to his extremities and trunk.  Is blanching.  No petechiae or purpura.  No vesicles.) present.  Neurological:     Mental Status: He is alert and oriented to person, place, and time.     (all labs ordered are listed, but only abnormal results are displayed) Labs Reviewed - No  data to display  EKG: None  Radiology: No results found.   Procedures   Medications Ordered in the ED  sodium chloride  0.9 % bolus 500 mL (500 mLs Intravenous New Bag/Given 11/27/24 1619)  dexamethasone  (DECADRON ) injection 10 mg (10 mg Intravenous Given 11/27/24 1615)  diphenhydrAMINE  (BENADRYL ) injection 25 mg (25 mg Intravenous Given 11/27/24 1616)  famotidine  (PEPCID ) IVPB 20 mg premix (0 mg Intravenous Stopped 11/27/24 1650)  fentaNYL  (SUBLIMAZE ) injection 50 mcg (50 mcg Intravenous Given 11/27/24 1614)                                    Medical Decision Making Risk OTC drugs. Prescription drug management.   This patient presents to the ED for concern of rash, shoulder pain, this involves an extensive number of treatment options, and is a complaint that carries with it a high risk of complications and morbidity.  I considered the following differential and admission for this acute, potentially life threatening condition.  The differential diagnosis includes anaphylaxis, urticaria, allergic reaction, viral exanthem, autoimmune issue  MDM:    Patient is 69 year old who presents with an urticarial type rash.  He does not appear to have any respiratory issues or airway swelling.  No hypoxia.  He was given Decadron , Pepcid  and Benadryl .  He was given IV fluids.  He was monitored in the ED.  He is overall feeling much better.  His rash and itching has improved.  He was having a headache that has improved.  Will give him information about following up with an allergist.  I advised him and appropriate use of his EpiPen  as needed.  He was given a prescription to start Zyrtec  and  Pepcid .  He was given a dose of pain medication for his shoulder.  He had recent x-rays of the shoulder.  Given no change in symptoms, I did not repeat these.  He is on chronic oxycodone  10 mg tablets.  He was advised to call Dr. Cristy for close follow-up regarding this.  He was discharged home in good condition.   Return precautions were given.  (Labs, imaging, consults)  Labs: I Ordered, and personally interpreted labs.  The pertinent results include:     Imaging Studies ordered: I ordered imaging studies including   I independently visualized and interpreted imaging. I agree with the radiologist interpretation  Additional history obtained from wife at bedside.  External records from outside source obtained and reviewed including prior notes  Cardiac Monitoring: The patient was not maintained on a cardiac monitor.  If on the cardiac monitor, I personally viewed and interpreted the cardiac monitored which showed an underlying rhythm of:    Reevaluation: After the interventions noted above, I reevaluated the patient and found that they have :improved  Social Determinants of Health:    Disposition: Discharged to home  Co morbidities that complicate the patient evaluation  Past Medical History:  Diagnosis Date   Anemia    Aneurysm of left common iliac artery    Arthritis    CAD (coronary artery disease)    Cancer (HCC)    basal cell on scalp   Chronic kidney disease    COPD (chronic obstructive pulmonary disease) (HCC)    GERD (gastroesophageal reflux disease)    Hearing loss    History of kidney stones    HLD (hyperlipidemia)    Hypertension    Lumbar radiculopathy    Myocardial infarction [facility])    Neuromuscular disorder (HCC)    radiculopathy, from back and neck   Peripheral vascular disease    Pneumonia    Stroke (HCC) 2021   TIA     Medicines Meds ordered this encounter  Medications   sodium chloride  0.9 % bolus 500 mL   dexamethasone  (DECADRON ) injection 10 mg   diphenhydrAMINE  (BENADRYL ) injection 25 mg   famotidine  (PEPCID ) IVPB 20 mg premix   fentaNYL  (SUBLIMAZE ) injection 50 mcg   famotidine  (PEPCID ) 20 MG tablet    Sig: Take 1 tablet (20 mg total) by mouth 2 (two) times daily.    Dispense:  30 tablet    Refill:  0   cetirizine  (ZYRTEC  ALLERGY) 10 MG tablet     Sig: Take 1 tablet (10 mg total) by mouth daily.    Dispense:  30 tablet    Refill:  0    I have reviewed the patients home medicines and have made adjustments as needed  Problem List / ED Course: Problem List Items Addressed This Visit   None Visit Diagnoses       Urticaria    -  Primary                Final diagnoses:  Urticaria    ED Discharge Orders          Ordered    famotidine  (PEPCID ) 20 MG tablet  2 times daily        11/27/24 1710    cetirizine  (ZYRTEC  ALLERGY) 10 MG tablet  Daily        11/27/24 1710               Lenor Hollering, MD 11/27/24 1714  "

## 2024-11-27 NOTE — ED Triage Notes (Addendum)
 Pt was seen here for rash 2d ago, treated for allergic reaction; reports itching all over again today with some hives noted to BLE: took epi pen at 1330; no obvious distress or oral swelling; pt stopped taking prednisone  that was prescribed d/t he did not like the way it made him feel; also reports LT shoulder pain, no injury, hx of replacement, saw ortho for same this week

## 2024-11-27 NOTE — Discharge Instructions (Signed)
 Start taking the Zyrtec  daily.  You can also start taking Pepcid  daily.  Make an appointment to follow-up with the allergist.  Make an appointment to follow-up with Dr. Cristy regarding your shoulder pain.  Return to the emergency room if you have any worsening symptoms.

## 2024-11-30 ENCOUNTER — Encounter (HOSPITAL_COMMUNITY): Payer: Self-pay | Admitting: Orthopaedic Surgery

## 2024-11-30 ENCOUNTER — Other Ambulatory Visit: Payer: Self-pay

## 2024-11-30 NOTE — Progress Notes (Signed)
 Attempted to obtain medical history via telephone, unable to reach at this time. HIPAA compliant voicemail message left requesting return call to pre surgical testing department.

## 2024-11-30 NOTE — Progress Notes (Signed)
 Attempted to obtain medical history via telephone, unable to reach at this time. Voicemail not set up to leave a message.

## 2024-11-30 NOTE — H&P (Signed)
 "   PREOPERATIVE H&P  Chief Complaint: Infection of prosthetic shoulder joint  HPI: Roy Caldwell is a 69 y.o. male who presents for preoperative history and physical prior to scheduled surgery, Procedures: REVISION, REVERSE TOTAL ARTHROPLASTY, SHOULDER.   Patient is a 69 year old male well-known to the office of the patient of Dr. Cristy. He had undergone a revision left reverse total shoulder with Dr. Cristy on 10/04/2024. He states he was gradually improving with this then began having progressively worsening pain of the left shoulder. He was seen here in the office last week for evaluation and given IM injections. It sounds like since that visit there has been to ER trips where he had presented with severe rash or hives and treated again with steroids. They did obtain some blood work as well which she had on MyChart for evaluation white count was around 15. He denies any fevers or sweats does report chills and some fluctuance noted to the anterior left shoulder. He had noticed more of a fluid pocket when he woke up this morning was concern for potential infection and is here for evaluation and treatment today.  Symptoms are rated as moderate to severe, and have been worsening.  This is significantly impairing activities of daily living.    Please see clinic note for further details on this patient's care.    He has elected for surgical management.   Past Medical History:  Diagnosis Date   Anemia    Aneurysm of left common iliac artery    Arthritis    CAD (coronary artery disease)    Cancer (HCC)    basal cell on scalp   Chronic kidney disease    COPD (chronic obstructive pulmonary disease) (HCC)    GERD (gastroesophageal reflux disease)    Hearing loss    History of kidney stones    HLD (hyperlipidemia)    Hypertension    Lumbar radiculopathy    Myocardial infarction (HCC)    Neuromuscular disorder (HCC)    radiculopathy, from back and neck   Peripheral vascular disease     Pneumonia    Stroke (HCC) 2021   TIA   Past Surgical History:  Procedure Laterality Date   BACK SURGERY     CARDIAC CATHETERIZATION  2022   DES x 2,  done at [facility]   by Dr. Wadie Counter   CARPAL TUNNEL RELEASE Bilateral    EYE SURGERY     FB removal   HERNIA REPAIR     ;umbilical   JOINT REPLACEMENT Right 2015   total shoulder done by Dr. Lewanda at [facility]   OPEN TREATMENT, DISLOCATION, DISTAL RADIOULNAR JOINT Left 07/26/2024   Procedure: OPEN TREATMENT, DISLOCATION;  Surgeon: Cristy Bonner DASEN, MD;  Location: [facility];  Service: Orthopedics;  Laterality: Left;  ADD CODE 76544 (ANTERIOR CAPSULE ORBITER)   ORIF SHOULDER FRACTURE Left 07/07/2024   Procedure: OPEN REDUCTION INTERNAL FIXATION (ORIF) SHOULDER FRACTURE;  Surgeon: Cristy Bonner DASEN, MD;  Location: [facility];  Service: Orthopedics;  Laterality: Left;   REPLACEMENT TOTAL KNEE Bilateral    right  2015   left  2023   REVISION TOTAL SHOULDER TO REVERSE TOTAL SHOULDER Left 07/07/2024   Procedure: REVISION, REVERSE TOTAL ARTHROPLASTY, SHOULDER;  Surgeon: Cristy Bonner DASEN, MD;  Location: [facility];  Service: Orthopedics;  Laterality: Left;   REVISION TOTAL SHOULDER TO REVERSE TOTAL SHOULDER Left 07/26/2024   Procedure: REVISION, REVERSE TOTAL ARTHROPLASTY, SHOULDER;  Surgeon: Cristy Bonner DASEN, MD;  Location: [facility];  Service: Orthopedics;  Laterality: Left;   REVISION TOTAL SHOULDER TO REVERSE TOTAL SHOULDER Left 10/04/2024   Procedure: REVISION, REVERSE TOTAL ARTHROPLASTY, SHOULDER;  Surgeon: Cristy Bonner DASEN, MD;  Location: [facility];  Service: Orthopedics;  Laterality: Left;   SHOULDER OPEN ROTATOR CUFF REPAIR Left 10/04/2024   Procedure: REPAIR, ROTATOR CUFF, OPEN;  Surgeon: Cristy Bonner DASEN, MD;  Location: [facility];  Service: Orthopedics;  Laterality: Left;  open treatment of shoulder dislocation   SYNOVECTOMY Left 07/26/2024   Procedure: SYNOVECTOMY;  Surgeon: Cristy Bonner DASEN, MD;  Location: [facility];  Service: Orthopedics;  Laterality: Left;    SYNOVECTOMY Left 10/04/2024   Procedure: SYNOVECTOMY;  Surgeon: Cristy Bonner DASEN, MD;  Location: [facility];  Service: Orthopedics;  Laterality: Left;   TOTAL SHOULDER REPLACEMENT Left 2023   done at [facility]  by Dr. Lewanda   WRIST FUSION Left    Social History   Socioeconomic History   Marital status: Married    Spouse name: Not on file   Number of children: Not on file   Years of education: Not on file   Highest education level: Not on file  Occupational History   Not on file  Tobacco Use   Smoking status: Former    Types: Cigarettes    Start date: 2023    Quit date: 1973    Years since quitting: 53.0   Smokeless tobacco: Never  Vaping Use   Vaping status: Never Used  Substance and Sexual Activity   Alcohol use: Not Currently   Drug use: Not Currently   Sexual activity: Not Currently  Other Topics Concern   Not on file  Social History Narrative   Not on file   Social Drivers of Health   Tobacco Use: Medium Risk (11/27/2024)   Patient History    Smoking Tobacco Use: Former    Smokeless Tobacco Use: Never    Passive Exposure: Not on Actuary Strain: Not on file  Food Insecurity: No Food Insecurity (10/04/2024)   Epic    Worried About Programme Researcher, Broadcasting/film/video in the Last Year: Never true    Ran Out of Food in the Last Year: Never true  Transportation Needs: No Transportation Needs (10/04/2024)   Epic    Lack of Transportation (Medical): No    Lack of Transportation (Non-Medical): No  Physical Activity: Not on file  Stress: Not on file  Social Connections: Socially Integrated (10/04/2024)   Social Connection and Isolation Panel    Frequency of Communication with Friends and Family: Twice a week    Frequency of Social Gatherings with Friends and Family: Once a week    Attends Religious Services: More than 4 times per year    Active Member of [employer] or Organizations: No    Attends Engineer, Structural: More than 4 times per year    Marital Status: Married   Depression (PHQ2-9): Not on file  Alcohol Screen: Not on file  Housing: Low Risk (10/04/2024)   Epic    Unable to Pay for Housing in the Last Year: No    Number of Times Moved in the Last Year: 0    Homeless in the Last Year: No  Utilities: Not At Risk (10/04/2024)   Epic    Threatened with loss of utilities: No  Health Literacy: Not on file   No family history on file. Allergies[1] Prior to Admission medications  Medication Sig Start Date End Date Taking? Authorizing Provider  alfuzosin  (UROXATRAL )  10 MG 24 hr tablet Take 10 mg by mouth daily.    [provider]  aspirin  EC 81 MG tablet Take 81 mg by mouth daily. Swallow whole.    [provider]  cetirizine  (ZYRTEC  ALLERGY) 10 MG tablet Take 1 tablet (10 mg total) by mouth daily. 11/27/24   Lenor Hollering, MD  Cholecalciferol (VITAMIN D3) 250 MCG (10000 UT) capsule Take 10,000 Units by mouth daily.    [provider]  cilostazol  (PLETAL ) 50 MG tablet Take 50 mg by mouth 2 (two) times daily. 04/07/24 04/07/25  [provider]  clopidogrel  (PLAVIX ) 75 MG tablet Take 75 mg by mouth daily.    [provider]  cyanocobalamin 1000 MCG tablet Take 1,000 mcg by mouth daily.    [provider]  diclofenac  (VOLTAREN ) 75 MG EC tablet Take 1 tablet (75 mg total) by mouth 2 (two) times daily. 07/27/24   Jerimy Johanson, Aleck SAILOR, PA-C  EPINEPHrine  0.3 mg/0.3 mL IJ SOAJ injection Inject 0.3 mg into the muscle as needed for anaphylaxis. 11/23/24   Long, Fonda MATSU, MD  ezetimibe  (ZETIA ) 10 MG tablet Take 10 mg by mouth every evening.    [provider]  famotidine  (PEPCID ) 20 MG tablet Take 1 tablet (20 mg total) by mouth 2 (two) times daily. 11/27/24   Lenor Hollering, MD  finasteride  (PROSCAR ) 5 MG tablet Take 5 mg by mouth daily.    [provider]  methocarbamol  (ROBAXIN ) 500 MG tablet Take 1 tablet (500 mg total) by mouth every 8 (eight) hours as needed for muscle spasms. 07/27/24   Benita Boonstra,  Roxann Vierra N, PA-C  methocarbamol  (ROBAXIN ) 500 MG tablet Take 1 tablet (500 mg total) by mouth every 8 (eight) hours as needed for muscle spasms. 10/05/24   Journie Howson N, PA-C  Multiple Vitamin (MULTIVITAMIN WITH MINERALS) TABS tablet Take 1 tablet by mouth daily. Centrum    [provider]  nitroGLYCERIN  (NITROSTAT ) 0.4 MG SL tablet Place 0.4 mg under the tongue every 5 (five) minutes as needed for chest pain.    [provider]  omeprazole (PRILOSEC) 20 MG capsule Take 40 mg by mouth in the morning. 09/17/17   [provider]  oxyCODONE -acetaminophen  (PERCOCET) 10-325 MG tablet Take 1 tablet by mouth every 4 (four) hours as needed for pain. for pain    [provider]  pravastatin  (PRAVACHOL ) 40 MG tablet Take 40 mg by mouth daily.    [provider]  sildenafil (REVATIO) 20 MG tablet Take 20 mg by mouth daily as needed (ED).    [provider]    ROS: All other systems have been reviewed and were otherwise negative with the exception of those mentioned in the HPI and as above.  Physical Exam: General: Alert, no acute distress Cardiovascular: No pedal edema Respiratory: No cyanosis, no use of accessory musculature GI: No organomegaly, abdomen is soft and non-tender Skin: No lesions in the area of chief complaint Neurologic: Sensation intact distally Psychiatric: Patient is competent for consent with normal mood and affect Lymphatic: No axillary or cervical lymphadenopathy  MUSCULOSKELETAL:  Patient seated in the exam room some obvious discomfort favoring the left shoulder. Alert and oriented. Appears appropriate age. Examination left upper extremity shows neurovascularly intact. He has pain through any attempted active or passive range of motion of the left shoulder. There is no obvious warmth or erythema but does have what appears to be a pocket of fluid in the anterior shoulder which is fluctuant and tender  to  palpation.  Imaging: Last x-rays demonstrate no acute bony abnormalities. Hardware in place without any signs of dislocation.   BMI: Estimated body mass index is 32.28 kg/m as calculated from the following:   Height as of 11/27/24: 5' 6 (1.676 m).   Weight as of 11/27/24: 90.7 kg.  Lab Results  Component Value Date   ALBUMIN  4.1 11/23/2024   Diabetes: Patient does not have a diagnosis of diabetes.     Smoking Status:      Assessment: Infection of prosthetic shoulder joint  Plan: Plan for Procedures: REVISION, REVERSE TOTAL ARTHROPLASTY, SHOULDER   The risks benefits and alternatives were discussed with the patient including but not limited to the risks of nonoperative treatment, versus surgical intervention including infection, bleeding, nerve injury,  blood clots, cardiopulmonary complications, morbidity, mortality, among others, and they were willing to proceed.   We additionally specifically discussed risks of axillary nerve injury, infection, periprosthetic fracture, continued pain and longevity of implants prior to beginning procedure.    Patient will be admitted for inpatient treatment for surgery, pain control, OT, prophylactic antibiotics, VTE prophylaxis, and discharge planning. The patient is planning to be discharged home with outpatient PT.   The patient acknowledged the explanation, agreed to proceed with the plan and consent was signed.   Operative Plan: Left revision reverse total shoulder arthroplasty and washout Discharge Medications: standard DVT Prophylaxis: resume home meds Physical Therapy: delayed Special Discharge needs: +/-   Aleck LOISE Stalling, PA-C  11/30/2024 8:25 AM     [1]  Allergies Allergen Reactions   Bee Venom Anaphylaxis and Hives   Shellfish Allergy Anaphylaxis, Hives and Dermatitis    Pt can tolerate shrimp   Statins    Tape Itching    Adhesive   "

## 2024-11-30 NOTE — Progress Notes (Addendum)
 For Anesthesia: PCP - Franky Ozell Molt, PA-C  Cardiologist - Darice Earnie Sharps, MD last office visit note 07/01/24 in [facility]  Bowel Prep reminder: N/A  Chest x-ray - 11/23/24 in [facility] EKG - 07/02/24 in [facility] Stress Test - 03/24/2024 [facility]  ECHO -  Cardiac Cath - 7/15/ 2022 Pacemaker/ICD device last checked: N/A Pacemaker orders received: N/A Device Rep notified: N/A  Spinal Cord Stimulator: N/A  Sleep Study - N/A CPAP - N/A  Fasting Blood Sugar - N/A Checks Blood Sugar _____ times a day Date and result of last Hgb A1c-  Last dose of GLP1 agonist- N/A GLP1 instructions: Hold 7 days prior to schedule (Hold 24 hours-daily)   Last dose of SGLT-2 inhibitors- N/A SGLT-2 instructions: Hold 72 hours prior to surgery  Blood Thinner Instructions: Cilostazol  (Pletal ) Last Dose: 11/28/24 Time last taken: 8 AM  Blood Thinner Instructions: Clopidogrel  (Plavix )  Last Dose:11/28/24 Time last taken: 8AM  Aspirin  Instructions:  Last Dose:11/28/24 Time last taken: 11/28/24  Activity level: Can go up a flight of stairs and activities of daily living without stopping and without chest pain and/or shortness of breath    Anesthesia review: CAD, History of MI, PVD, CKD, HTN, COPD, History of stroke, Iliac aneurysm   Patient denies shortness of breath, fever, cough and chest pain at PAT appointment   Patient verbalized understanding of instructions that were reviewed over the telephone.

## 2024-12-01 ENCOUNTER — Ambulatory Visit (HOSPITAL_COMMUNITY): Admitting: Certified Registered"

## 2024-12-01 ENCOUNTER — Inpatient Hospital Stay (HOSPITAL_COMMUNITY)

## 2024-12-01 ENCOUNTER — Inpatient Hospital Stay (HOSPITAL_COMMUNITY)
Admission: AD | Admit: 2024-12-01 | Discharge: 2024-12-03 | DRG: 483 | Disposition: A | Discharge status: Home / Self Care | Attending: Orthopaedic Surgery | Admitting: Orthopaedic Surgery

## 2024-12-01 ENCOUNTER — Encounter (HOSPITAL_COMMUNITY): Payer: Self-pay | Admitting: Orthopaedic Surgery

## 2024-12-01 ENCOUNTER — Encounter (HOSPITAL_COMMUNITY)
Admission: AD | Disposition: A | Discharge status: Home / Self Care | Payer: Self-pay | Source: Home / Self Care | Attending: Orthopaedic Surgery

## 2024-12-01 DIAGNOSIS — M00012 Staphylococcal arthritis, left shoulder: Secondary | ICD-10-CM | POA: Diagnosis present

## 2024-12-01 DIAGNOSIS — Y831 Surgical operation with implant of artificial internal device as the cause of abnormal reaction of the patient, or of later complication, without mention of misadventure at the time of the procedure: Secondary | ICD-10-CM | POA: Diagnosis present

## 2024-12-01 DIAGNOSIS — B957 Other staphylococcus as the cause of diseases classified elsewhere: Secondary | ICD-10-CM

## 2024-12-01 DIAGNOSIS — Z79899 Other long term (current) drug therapy: Secondary | ICD-10-CM

## 2024-12-01 DIAGNOSIS — I252 Old myocardial infarction: Secondary | ICD-10-CM | POA: Diagnosis not present

## 2024-12-01 DIAGNOSIS — J449 Chronic obstructive pulmonary disease, unspecified: Secondary | ICD-10-CM | POA: Diagnosis present

## 2024-12-01 DIAGNOSIS — I10 Essential (primary) hypertension: Secondary | ICD-10-CM | POA: Diagnosis present

## 2024-12-01 DIAGNOSIS — T8459XD Infection and inflammatory reaction due to other internal joint prosthesis, subsequent encounter: Secondary | ICD-10-CM | POA: Diagnosis not present

## 2024-12-01 DIAGNOSIS — T8459XS Infection and inflammatory reaction due to other internal joint prosthesis, sequela: Secondary | ICD-10-CM | POA: Diagnosis not present

## 2024-12-01 DIAGNOSIS — E785 Hyperlipidemia, unspecified: Secondary | ICD-10-CM | POA: Diagnosis present

## 2024-12-01 DIAGNOSIS — Z6833 Body mass index (BMI) 33.0-33.9, adult: Secondary | ICD-10-CM

## 2024-12-01 DIAGNOSIS — Z1619 Resistance to other specified beta lactam antibiotics: Secondary | ICD-10-CM | POA: Diagnosis present

## 2024-12-01 DIAGNOSIS — L02414 Cutaneous abscess of left upper limb: Secondary | ICD-10-CM | POA: Diagnosis present

## 2024-12-01 DIAGNOSIS — Z87891 Personal history of nicotine dependence: Secondary | ICD-10-CM | POA: Diagnosis not present

## 2024-12-01 DIAGNOSIS — K219 Gastro-esophageal reflux disease without esophagitis: Secondary | ICD-10-CM | POA: Diagnosis present

## 2024-12-01 DIAGNOSIS — Z91013 Allergy to seafood: Secondary | ICD-10-CM

## 2024-12-01 DIAGNOSIS — Z85828 Personal history of other malignant neoplasm of skin: Secondary | ICD-10-CM | POA: Diagnosis not present

## 2024-12-01 DIAGNOSIS — T8450XA Infection and inflammatory reaction due to unspecified internal joint prosthesis, initial encounter: Secondary | ICD-10-CM | POA: Diagnosis present

## 2024-12-01 DIAGNOSIS — Z7902 Long term (current) use of antithrombotics/antiplatelets: Secondary | ICD-10-CM

## 2024-12-01 DIAGNOSIS — I2510 Atherosclerotic heart disease of native coronary artery without angina pectoris: Secondary | ICD-10-CM | POA: Diagnosis present

## 2024-12-01 DIAGNOSIS — Z1611 Resistance to penicillins: Secondary | ICD-10-CM | POA: Diagnosis present

## 2024-12-01 DIAGNOSIS — Z955 Presence of coronary angioplasty implant and graft: Secondary | ICD-10-CM | POA: Diagnosis not present

## 2024-12-01 DIAGNOSIS — Z7982 Long term (current) use of aspirin: Secondary | ICD-10-CM | POA: Diagnosis not present

## 2024-12-01 DIAGNOSIS — R519 Headache, unspecified: Secondary | ICD-10-CM | POA: Diagnosis not present

## 2024-12-01 DIAGNOSIS — D62 Acute posthemorrhagic anemia: Secondary | ICD-10-CM | POA: Diagnosis not present

## 2024-12-01 DIAGNOSIS — Z87442 Personal history of urinary calculi: Secondary | ICD-10-CM | POA: Diagnosis not present

## 2024-12-01 DIAGNOSIS — I739 Peripheral vascular disease, unspecified: Secondary | ICD-10-CM | POA: Diagnosis present

## 2024-12-01 DIAGNOSIS — Z96612 Presence of left artificial shoulder joint: Principal | ICD-10-CM

## 2024-12-01 DIAGNOSIS — Z96653 Presence of artificial knee joint, bilateral: Secondary | ICD-10-CM | POA: Diagnosis present

## 2024-12-01 DIAGNOSIS — B9561 Methicillin susceptible Staphylococcus aureus infection as the cause of diseases classified elsewhere: Secondary | ICD-10-CM | POA: Diagnosis present

## 2024-12-01 DIAGNOSIS — Z8673 Personal history of transient ischemic attack (TIA), and cerebral infarction without residual deficits: Secondary | ICD-10-CM

## 2024-12-01 DIAGNOSIS — T8450XD Infection and inflammatory reaction due to unspecified internal joint prosthesis, subsequent encounter: Secondary | ICD-10-CM

## 2024-12-01 DIAGNOSIS — M25512 Pain in left shoulder: Secondary | ICD-10-CM | POA: Diagnosis present

## 2024-12-01 DIAGNOSIS — T8459XA Infection and inflammatory reaction due to other internal joint prosthesis, initial encounter: Secondary | ICD-10-CM

## 2024-12-01 HISTORY — DX: Dyspnea, unspecified: R06.00

## 2024-12-01 HISTORY — PX: IRRIGATION AND DEBRIDEMENT SHOULDER: SHX5880

## 2024-12-01 HISTORY — DX: Unspecified hearing loss, unspecified ear: H91.90

## 2024-12-01 HISTORY — PX: REVISION TOTAL SHOULDER TO REVERSE TOTAL SHOULDER: SHX6313

## 2024-12-01 HISTORY — PX: SYNOVECTOMY: SHX5180

## 2024-12-01 LAB — TYPE AND SCREEN
ABO/RH(D): A POS
Antibody Screen: NEGATIVE

## 2024-12-01 LAB — SYNOVIAL CELL COUNT + DIFF, W/ CRYSTALS
Crystals, Fluid: NONE SEEN
Eosinophils-Synovial: 0 % (ref 0–1)
Lymphocytes-Synovial Fld: 1 % (ref 0–20)
Monocyte-Macrophage-Synovial Fluid: 6 % — ABNORMAL LOW (ref 50–90)
Neutrophil, Synovial: 93 % — ABNORMAL HIGH (ref 0–25)
WBC, Synovial: 486820 /mm3 — ABNORMAL HIGH (ref 0–200)

## 2024-12-01 LAB — SURGICAL PCR SCREEN
MRSA, PCR: NEGATIVE
Staphylococcus aureus: NEGATIVE

## 2024-12-01 LAB — CBC
HCT: 28.2 % — ABNORMAL LOW (ref 39.0–52.0)
Hemoglobin: 9.2 g/dL — ABNORMAL LOW (ref 13.0–17.0)
MCH: 27.3 pg (ref 26.0–34.0)
MCHC: 32.6 g/dL (ref 30.0–36.0)
MCV: 83.7 fL (ref 80.0–100.0)
Platelets: 335 K/uL (ref 150–400)
RBC: 3.37 MIL/uL — ABNORMAL LOW (ref 4.22–5.81)
RDW: 15.7 % — ABNORMAL HIGH (ref 11.5–15.5)
WBC: 22.8 K/uL — ABNORMAL HIGH (ref 4.0–10.5)
nRBC: 0 % (ref 0.0–0.2)

## 2024-12-01 LAB — CREATININE, SERUM
Creatinine, Ser: 0.86 mg/dL (ref 0.61–1.24)
GFR, Estimated: >60 mL/min

## 2024-12-01 MED ORDER — ACETAMINOPHEN 500 MG PO TABS
1000.0000 mg | ORAL_TABLET | Freq: Four times a day (QID) | ORAL | Status: AC
  Administered 2024-12-01 – 2024-12-02 (×4): 1000 mg via ORAL
  Filled 2024-12-01 (×4): qty 2

## 2024-12-01 MED ORDER — ONDANSETRON HCL 4 MG PO TABS: 4.0000 mg | ORAL_TABLET | Freq: Four times a day (QID) | ORAL | Status: DC | PRN

## 2024-12-01 MED ORDER — METHOCARBAMOL 500 MG PO TABS
500.0000 mg | ORAL_TABLET | Freq: Once | ORAL | Status: AC
  Administered 2024-12-01: 500 mg via ORAL

## 2024-12-01 MED ORDER — VANCOMYCIN HCL IN DEXTROSE 1-5 GM/200ML-% IV SOLN: 1000.0000 mg | Freq: Two times a day (BID) | INTRAVENOUS | Status: DC

## 2024-12-01 MED ORDER — SUGAMMADEX SODIUM 200 MG/2ML IV SOLN
INTRAVENOUS | Status: AC
  Filled 2024-12-01: qty 2

## 2024-12-01 MED ORDER — TOBRAMYCIN SULFATE 1.2 G IJ SOLR
INTRAMUSCULAR | Status: AC
  Filled 2024-12-01: qty 1.2

## 2024-12-01 MED ORDER — ZOLPIDEM TARTRATE 5 MG PO TABS: 5.0000 mg | ORAL_TABLET | Freq: Every evening | ORAL | Status: DC | PRN

## 2024-12-01 MED ORDER — BISACODYL 10 MG RE SUPP: 10.0000 mg | Freq: Every day | RECTAL | Status: DC | PRN

## 2024-12-01 MED ORDER — CLOPIDOGREL BISULFATE 75 MG PO TABS
75.0000 mg | ORAL_TABLET | Freq: Every day | ORAL | Status: DC
  Administered 2024-12-02 – 2024-12-03 (×2): 75 mg via ORAL
  Filled 2024-12-01 (×2): qty 1

## 2024-12-01 MED ORDER — MEPERIDINE HCL 25 MG/ML IJ SOLN: 6.2500 mg | INTRAMUSCULAR | Status: DC | PRN

## 2024-12-01 MED ORDER — BUPIVACAINE HCL (PF) 0.25 % IJ SOLN
INTRAMUSCULAR | Status: AC
  Filled 2024-12-01: qty 30

## 2024-12-01 MED ORDER — VANCOMYCIN HCL IN DEXTROSE 1-5 GM/200ML-% IV SOLN
1000.0000 mg | Freq: Two times a day (BID) | INTRAVENOUS | Status: DC
  Administered 2024-12-02: 1000 mg via INTRAVENOUS
  Filled 2024-12-01 (×3): qty 200

## 2024-12-01 MED ORDER — PHENOL 1.4 % MT LIQD: 1.0000 | OROMUCOSAL | Status: DC | PRN

## 2024-12-01 MED ORDER — OXYCODONE HCL 5 MG/5ML PO SOLN: 5.0000 mg | Freq: Once | ORAL | Status: AC | PRN

## 2024-12-01 MED ORDER — FENTANYL CITRATE (PF) 100 MCG/2ML IJ SOLN
INTRAMUSCULAR | Status: AC
  Filled 2024-12-01: qty 2

## 2024-12-01 MED ORDER — FAMOTIDINE 20 MG PO TABS
20.0000 mg | ORAL_TABLET | Freq: Two times a day (BID) | ORAL | Status: DC | PRN
  Administered 2024-12-01: 20 mg via ORAL
  Filled 2024-12-01: qty 1

## 2024-12-01 MED ORDER — NITROGLYCERIN 0.4 MG SL SUBL: 0.4000 mg | SUBLINGUAL_TABLET | SUBLINGUAL | Status: DC | PRN

## 2024-12-01 MED ORDER — HYDROMORPHONE HCL 1 MG/ML IJ SOLN
0.5000 mg | INTRAMUSCULAR | Status: DC | PRN
  Administered 2024-12-02: 1 mg via INTRAVENOUS
  Filled 2024-12-01: qty 1

## 2024-12-01 MED ORDER — MIDAZOLAM HCL (PF) 2 MG/2ML IJ SOLN
0.5000 mg | Freq: Once | INTRAMUSCULAR | Status: AC | PRN
  Administered 2024-12-01: 1 mg via INTRAVENOUS

## 2024-12-01 MED ORDER — LACTATED RINGERS IV SOLN: INTRAVENOUS | Status: DC

## 2024-12-01 MED ORDER — ACETAMINOPHEN 500 MG PO TABS: 1000.0000 mg | ORAL_TABLET | Freq: Once | ORAL | Status: DC

## 2024-12-01 MED ORDER — LIDOCAINE HCL (CARDIAC) PF 100 MG/5ML IV SOSY
PREFILLED_SYRINGE | INTRAVENOUS | Status: DC | PRN
  Administered 2024-12-01: 30 mg via INTRAVENOUS

## 2024-12-01 MED ORDER — EZETIMIBE 10 MG PO TABS
10.0000 mg | ORAL_TABLET | Freq: Every evening | ORAL | Status: DC
  Administered 2024-12-02: 10 mg via ORAL
  Filled 2024-12-01: qty 1

## 2024-12-01 MED ORDER — CILOSTAZOL 100 MG PO TABS
50.0000 mg | ORAL_TABLET | Freq: Two times a day (BID) | ORAL | Status: DC
  Administered 2024-12-02 – 2024-12-03 (×3): 50 mg via ORAL
  Filled 2024-12-01 (×4): qty 0.5

## 2024-12-01 MED ORDER — DEXAMETHASONE SOD PHOSPHATE PF 10 MG/ML IJ SOLN
INTRAMUSCULAR | Status: AC
  Filled 2024-12-01: qty 1

## 2024-12-01 MED ORDER — OXYCODONE HCL 5 MG PO TABS
5.0000 mg | ORAL_TABLET | ORAL | Status: DC | PRN
  Administered 2024-12-01: 10 mg via ORAL
  Administered 2024-12-02: 5 mg via ORAL
  Administered 2024-12-02 (×3): 10 mg via ORAL
  Filled 2024-12-01: qty 2
  Filled 2024-12-01 (×3): qty 1
  Filled 2024-12-01: qty 2
  Filled 2024-12-01 (×2): qty 1

## 2024-12-01 MED ORDER — ACETAMINOPHEN 325 MG PO TABS
325.0000 mg | ORAL_TABLET | Freq: Four times a day (QID) | ORAL | Status: DC | PRN
  Administered 2024-12-02: 650 mg via ORAL
  Filled 2024-12-01: qty 2

## 2024-12-01 MED ORDER — METHOCARBAMOL 500 MG PO TABS
ORAL_TABLET | ORAL | Status: AC
  Filled 2024-12-01: qty 1

## 2024-12-01 MED ORDER — MIDAZOLAM HCL (PF) 2 MG/2ML IJ SOLN
0.5000 mg | Freq: Once | INTRAMUSCULAR | Status: AC
  Administered 2024-12-01: 1 mg via INTRAVENOUS
  Filled 2024-12-01: qty 2

## 2024-12-01 MED ORDER — FINASTERIDE 5 MG PO TABS
5.0000 mg | ORAL_TABLET | Freq: Every day | ORAL | Status: DC
  Administered 2024-12-02 – 2024-12-03 (×2): 5 mg via ORAL
  Filled 2024-12-01 (×2): qty 1

## 2024-12-01 MED ORDER — BUPIVACAINE-EPINEPHRINE (PF) 0.5% -1:200000 IJ SOLN
INTRAMUSCULAR | Status: DC | PRN
  Administered 2024-12-01: 10 mL via PERINEURAL

## 2024-12-01 MED ORDER — TOBRAMYCIN SULFATE 1.2 G IJ SOLR
INTRAMUSCULAR | Status: DC | PRN
  Administered 2024-12-01: 1.2 g via TOPICAL

## 2024-12-01 MED ORDER — METHOCARBAMOL 1000 MG/10ML IJ SOLN: 500.0000 mg | Freq: Four times a day (QID) | INTRAMUSCULAR | Status: DC | PRN

## 2024-12-01 MED ORDER — ALUM & MAG HYDROXIDE-SIMETH 200-200-20 MG/5ML PO SUSP: 30.0000 mL | ORAL | Status: DC | PRN

## 2024-12-01 MED ORDER — PRONTOSAN WOUND IRRIGATION OPTIME
TOPICAL | Status: DC | PRN
  Administered 2024-12-01: 350 mL

## 2024-12-01 MED ORDER — FENTANYL CITRATE (PF) 50 MCG/ML IJ SOSY
25.0000 ug | PREFILLED_SYRINGE | Freq: Once | INTRAMUSCULAR | Status: AC
  Administered 2024-12-01: 50 ug via INTRAVENOUS
  Filled 2024-12-01: qty 2

## 2024-12-01 MED ORDER — HYDROMORPHONE HCL 1 MG/ML IJ SOLN
0.2500 mg | INTRAMUSCULAR | Status: DC | PRN
  Administered 2024-12-01 (×4): 0.5 mg via INTRAVENOUS

## 2024-12-01 MED ORDER — ALFUZOSIN HCL ER 10 MG PO TB24
10.0000 mg | ORAL_TABLET | Freq: Every day | ORAL | Status: DC
  Administered 2024-12-02 – 2024-12-03 (×2): 10 mg via ORAL
  Filled 2024-12-01 (×2): qty 1

## 2024-12-01 MED ORDER — ENOXAPARIN SODIUM 40 MG/0.4ML IJ SOSY
40.0000 mg | PREFILLED_SYRINGE | INTRAMUSCULAR | Status: DC
  Administered 2024-12-02 – 2024-12-03 (×2): 40 mg via SUBCUTANEOUS
  Filled 2024-12-01 (×2): qty 0.4

## 2024-12-01 MED ORDER — ROCURONIUM BROMIDE 10 MG/ML (PF) SYRINGE
PREFILLED_SYRINGE | INTRAVENOUS | Status: DC | PRN
  Administered 2024-12-01: 60 mg via INTRAVENOUS

## 2024-12-01 MED ORDER — DOCUSATE SODIUM 100 MG PO CAPS
100.0000 mg | ORAL_CAPSULE | Freq: Two times a day (BID) | ORAL | Status: DC
  Administered 2024-12-01 – 2024-12-03 (×4): 100 mg via ORAL
  Filled 2024-12-01 (×4): qty 1

## 2024-12-01 MED ORDER — SUGAMMADEX SODIUM 200 MG/2ML IV SOLN
INTRAVENOUS | Status: DC | PRN
  Administered 2024-12-01: 200 mg via INTRAVENOUS

## 2024-12-01 MED ORDER — MAGNESIUM CITRATE PO SOLN: 1.0000 | Freq: Once | ORAL | Status: DC | PRN

## 2024-12-01 MED ORDER — BUPIVACAINE LIPOSOME 1.3 % IJ SUSP
INTRAMUSCULAR | Status: DC | PRN
  Administered 2024-12-01: 10 mL via PERINEURAL

## 2024-12-01 MED ORDER — VANCOMYCIN HCL 1500 MG/300ML IV SOLN
1500.0000 mg | Freq: Once | INTRAVENOUS | Status: AC
  Administered 2024-12-01: 1500 mg via INTRAVENOUS
  Filled 2024-12-01: qty 300

## 2024-12-01 MED ORDER — VANCOMYCIN HCL 1000 MG IV SOLR
INTRAVENOUS | Status: AC
  Filled 2024-12-01: qty 20

## 2024-12-01 MED ORDER — KETOROLAC TROMETHAMINE 30 MG/ML IJ SOLN
INTRAMUSCULAR | Status: AC
  Filled 2024-12-01: qty 1

## 2024-12-01 MED ORDER — OXYCODONE HCL 5 MG PO TABS
5.0000 mg | ORAL_TABLET | Freq: Once | ORAL | Status: AC | PRN
  Administered 2024-12-01: 5 mg via ORAL

## 2024-12-01 MED ORDER — PHENYLEPHRINE 80 MCG/ML (10ML) SYRINGE FOR IV PUSH (FOR BLOOD PRESSURE SUPPORT)
PREFILLED_SYRINGE | INTRAVENOUS | Status: DC | PRN
  Administered 2024-12-01: 120 ug via INTRAVENOUS

## 2024-12-01 MED ORDER — SODIUM CHLORIDE 0.9 % IR SOLN
Status: DC | PRN
  Administered 2024-12-01: 7000 mL

## 2024-12-01 MED ORDER — PHENYLEPHRINE HCL-NACL 20-0.9 MG/250ML-% IV SOLN
INTRAVENOUS | Status: DC | PRN
  Administered 2024-12-01: 30 ug/min via INTRAVENOUS

## 2024-12-01 MED ORDER — ORAL CARE MOUTH RINSE: 15.0000 mL | Freq: Once | OROMUCOSAL | Status: AC

## 2024-12-01 MED ORDER — CHLORHEXIDINE GLUCONATE 0.12 % MT SOLN
15.0000 mL | Freq: Once | OROMUCOSAL | Status: AC
  Administered 2024-12-01: 15 mL via OROMUCOSAL

## 2024-12-01 MED ORDER — BUPIVACAINE HCL (PF) 0.25 % IJ SOLN
INTRAMUSCULAR | Status: DC | PRN
  Administered 2024-12-01: 30 mL

## 2024-12-01 MED ORDER — STERILE WATER FOR IRRIGATION IR SOLN
Status: DC | PRN
  Administered 2024-12-01: 2000 mL

## 2024-12-01 MED ORDER — EPHEDRINE SULFATE-NACL 50-0.9 MG/10ML-% IV SOSY
PREFILLED_SYRINGE | INTRAVENOUS | Status: DC | PRN
  Administered 2024-12-01: 4 mg via INTRAVENOUS

## 2024-12-01 MED ORDER — ACETAMINOPHEN 500 MG PO TABS
1000.0000 mg | ORAL_TABLET | Freq: Once | ORAL | Status: AC
  Administered 2024-12-01: 1000 mg via ORAL
  Filled 2024-12-01: qty 2

## 2024-12-01 MED ORDER — ONDANSETRON HCL 4 MG/2ML IJ SOLN
INTRAMUSCULAR | Status: AC
  Filled 2024-12-01: qty 2

## 2024-12-01 MED ORDER — FENTANYL CITRATE (PF) 100 MCG/2ML IJ SOLN
INTRAMUSCULAR | Status: DC | PRN
  Administered 2024-12-01 (×2): 50 ug via INTRAVENOUS
  Administered 2024-12-01: 100 ug via INTRAVENOUS

## 2024-12-01 MED ORDER — ONDANSETRON HCL 4 MG/2ML IJ SOLN
INTRAMUSCULAR | Status: DC | PRN
  Administered 2024-12-01: 4 mg via INTRAVENOUS

## 2024-12-01 MED ORDER — ONDANSETRON HCL 4 MG/2ML IJ SOLN: 4.0000 mg | Freq: Four times a day (QID) | INTRAMUSCULAR | Status: DC | PRN

## 2024-12-01 MED ORDER — PANTOPRAZOLE SODIUM 40 MG PO TBEC
40.0000 mg | DELAYED_RELEASE_TABLET | Freq: Every day | ORAL | Status: DC
  Administered 2024-12-02 – 2024-12-03 (×2): 40 mg via ORAL
  Filled 2024-12-01 (×2): qty 1

## 2024-12-01 MED ORDER — NAPROXEN 250 MG PO TABS
250.0000 mg | ORAL_TABLET | Freq: Two times a day (BID) | ORAL | Status: DC
  Administered 2024-12-02 – 2024-12-03 (×2): 250 mg via ORAL
  Filled 2024-12-01 (×3): qty 1

## 2024-12-01 MED ORDER — DIPHENHYDRAMINE HCL 12.5 MG/5ML PO ELIX
12.5000 mg | ORAL_SOLUTION | ORAL | Status: DC | PRN
  Administered 2024-12-01: 25 mg via ORAL
  Filled 2024-12-01: qty 10

## 2024-12-01 MED ORDER — METOCLOPRAMIDE HCL 5 MG PO TABS: 5.0000 mg | ORAL_TABLET | Freq: Three times a day (TID) | ORAL | Status: DC | PRN

## 2024-12-01 MED ORDER — VANCOMYCIN HCL 1 G IV SOLR
INTRAVENOUS | Status: DC | PRN
  Administered 2024-12-01: 1000 mg via TOPICAL

## 2024-12-01 MED ORDER — HYDROMORPHONE HCL 1 MG/ML IJ SOLN: 0.5000 mg | INTRAMUSCULAR | Status: DC | PRN

## 2024-12-01 MED ORDER — KETOROLAC TROMETHAMINE 30 MG/ML IJ SOLN
30.0000 mg | Freq: Once | INTRAMUSCULAR | Status: AC
  Administered 2024-12-01: 30 mg via INTRAVENOUS

## 2024-12-01 MED ORDER — POLYETHYLENE GLYCOL 3350 17 G PO PACK: 17.0000 g | PACK | Freq: Every day | ORAL | Status: DC | PRN

## 2024-12-01 MED ORDER — PROPOFOL 10 MG/ML IV BOLUS
INTRAVENOUS | Status: DC | PRN
  Administered 2024-12-01: 130 mg via INTRAVENOUS

## 2024-12-01 MED ORDER — OXYCODONE HCL 5 MG PO TABS
10.0000 mg | ORAL_TABLET | ORAL | Status: DC | PRN
  Administered 2024-12-03: 10 mg via ORAL
  Filled 2024-12-01: qty 2

## 2024-12-01 MED ORDER — PROPOFOL 10 MG/ML IV BOLUS
INTRAVENOUS | Status: AC
  Filled 2024-12-01: qty 20

## 2024-12-01 MED ORDER — MENTHOL 3 MG MT LOZG: 1.0000 | LOZENGE | OROMUCOSAL | Status: DC | PRN

## 2024-12-01 MED ORDER — MIDAZOLAM HCL 2 MG/2ML IJ SOLN
INTRAMUSCULAR | Status: AC
  Filled 2024-12-01: qty 2

## 2024-12-01 MED ORDER — METHOCARBAMOL 500 MG PO TABS
500.0000 mg | ORAL_TABLET | Freq: Four times a day (QID) | ORAL | Status: DC | PRN
  Administered 2024-12-02 – 2024-12-03 (×3): 500 mg via ORAL
  Filled 2024-12-01 (×4): qty 1

## 2024-12-01 MED ORDER — TRANEXAMIC ACID-NACL 1000-0.7 MG/100ML-% IV SOLN
1000.0000 mg | INTRAVENOUS | Status: AC
  Administered 2024-12-01: 1000 mg via INTRAVENOUS
  Filled 2024-12-01: qty 100

## 2024-12-01 MED ORDER — CEFAZOLIN SODIUM-DEXTROSE 2-4 GM/100ML-% IV SOLN
2.0000 g | INTRAVENOUS | Status: AC
  Administered 2024-12-01: 2 g via INTRAVENOUS
  Filled 2024-12-01: qty 100

## 2024-12-01 MED ORDER — HYDROMORPHONE HCL 1 MG/ML IJ SOLN
INTRAMUSCULAR | Status: AC
  Filled 2024-12-01: qty 2

## 2024-12-01 MED ORDER — METOCLOPRAMIDE HCL 5 MG/ML IJ SOLN: 5.0000 mg | Freq: Three times a day (TID) | INTRAMUSCULAR | Status: DC | PRN

## 2024-12-01 MED ORDER — ORAL CARE MOUTH RINSE: 15.0000 mL | OROMUCOSAL | Status: DC | PRN

## 2024-12-01 MED ORDER — OXYCODONE HCL 5 MG PO TABS
ORAL_TABLET | ORAL | Status: AC
  Filled 2024-12-01: qty 1

## 2024-12-01 MED ORDER — 0.9 % SODIUM CHLORIDE (POUR BTL) OPTIME
TOPICAL | Status: DC | PRN
  Administered 2024-12-01: 1000 mL

## 2024-12-01 NOTE — Anesthesia Procedure Notes (Signed)
 Anesthesia Regional Block: Interscalene brachial plexus block   Pre-Anesthetic Checklist: , timeout performed,  Correct Patient, Correct Site, Correct Laterality,  Correct Procedure, Correct Position, site marked,  Risks and benefits discussed,  Surgical consent,  Pre-op evaluation,  At surgeon's request and post-op pain management  Laterality: Left and Upper  Prep: chloraprep       Needles:  Injection technique: Single-shot  Needle Type: Echogenic Needle     Needle Length: 9cm  Needle Gauge: 21     Additional Needles:   Procedures:,,,, ultrasound used (permanent image in chart),,    Narrative:  Start time: 12/01/2024 12:31 PM End time: 12/01/2024 12:37 PM Injection made incrementally with aspirations every 5 mL.  Performed by: Personally  Anesthesiologist: Leonce Athens, MD  Additional Notes: Pt identified in Holding room.  Monitors applied. Working IV access confirmed. Timeout, Sterile prep L clavicle and neck.  #21ga ECHOgenic Arrow block needle to interscalene brachial plexus with US  guidance.  10cc 0.5% Bupivacaine  1:200k epi, Exparel  injected incrementally after negative test dose.  Patient asymptomatic, VSS, no heme aspirated, tolerated well.   JAYSON Leonce, MD

## 2024-12-01 NOTE — Consult Note (Signed)
 "        [facility]    Date of Admission:  12/01/2024           Reason for Consult: PJI    Active Problems:   * No active hospital problems. 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 Cristy on 09/22/2024 then began to have progressively worsening left  shoulder pain.  On 1/15 he received IM injection   In Ortho clinic on 1/19 patient underwent ultrasound-guided aspiration of possible fluid collection of the left shoulder yielding 22 cc of purulent drainage.  Cultures have grown Staph aureus with sensitivities pending.  US  showed well-circumscribed size hypoechoic fluid collection that appears superficial to the pectoralis major muscle.Taken to the OR for procedure above.  ID engaged for antibiotic recommendations - Of note patient had a ED visit on 1/13 for itchy rash, noted he has done some plugging on a house recently with rat droppings.  He was given epinephrine  and prednisone .  WBC 15K at that time. -Amoxicillin  was filled on 11/29/2018 Recommendations: - Continue vancomycin  - Follow or cultures - Place PICC - Anticipate 6 weeks of IV antibiotics followed by p.o. suppression - Standard precautions  Microbiology:   Antibiotics: Vancomycin   Cultures: Blood  Urine  Other OR cultures 01/21  HPI: Roy Caldwell is a 69 y.o. male with past medical history of anemia, CAD, basal cell cancer on scalp, CKD, COPD, hyperlipidemia, hypertension, MI, CVA followed by Dr. Cristy had undergone revision of left reverse total shoulder on 09/30/2024.  He had been gradually improving and then began to progressively get worse with left shoulder pain.  He was seen in  orthopedics office last week for evaluation and given IM injection.  He was then seen in the ED and was given steroids for rash/hives.  Cultures from clinic grew Staph aureus sensitivities pending ID engaged  as patient was taken to the OR for poly exchange and superficial implants   Review of Systems: Review of Systems  All other systems reviewed and are negative.   Past Medical History:  Diagnosis Date   Anemia    Aneurysm of left common iliac artery    Arthritis    CAD (coronary artery disease)    Cancer (HCC)    basal cell on scalp   Chronic kidney disease    COPD (chronic obstructive pulmonary disease) (HCC)    Dyspnea    GERD (gastroesophageal reflux disease)    Hearing loss    History of kidney stones    HLD (hyperlipidemia)    HOH (hard of hearing)    Hypertension    Lumbar radiculopathy    Myocardial infarction [facility])    Neuromuscular disorder (HCC)    radiculopathy, from back and neck   Peripheral vascular disease    Pneumonia    Stroke (HCC) 2021   TIA    Social History[1]  History reviewed. No pertinent family history. Scheduled Meds: Continuous Infusions:  lactated ringers  10 mL/hr at 12/01/24 1216   PRN Meds:.0.9 % irrigation (POUR BTL), bupivacaine  (PF), Prontosan Wound Irrigation - Optime, sodium chloride  irrigation, sterile water , tobramycin , vancomycin  Allergies[2]  OBJECTIVE: Blood pressure 105/61, pulse 85, temperature 99.4 F (37.4 C), resp. rate 17, height 5' 5 (1.651 m),  weight 90.7 kg, SpO2 98%.  Physical Exam Constitutional:      General: He is not in acute distress.    Appearance: He is normal weight. He is not toxic-appearing.  HENT:     Head: Normocephalic and atraumatic.     Right Ear: External ear normal.     Left Ear: External ear normal.     Nose: No congestion or rhinorrhea.     Mouth/Throat:     Mouth: Mucous membranes are moist.     Pharynx: Oropharynx is clear.  Eyes:     Extraocular Movements: Extraocular movements  intact.     Conjunctiva/sclera: Conjunctivae normal.     Pupils: Pupils are equal, round, and reactive to light.  Cardiovascular:     Rate and Rhythm: Normal rate and regular rhythm.     Heart sounds: No murmur heard.    No friction rub. No gallop.  Pulmonary:     Effort: Pulmonary effort is normal.     Breath sounds: Normal breath sounds.  Abdominal:     General: Abdomen is flat. Bowel sounds are normal.     Palpations: Abdomen is soft.  Musculoskeletal:        General: No swelling. Normal range of motion.     Cervical back: Normal range of motion and neck supple.  Skin:    General: Skin is warm and dry.  Neurological:     General: No focal deficit present.     Mental Status: He is oriented to person, place, and time.  Psychiatric:        Mood and Affect: Mood normal.     Lab Results Lab Results  Component Value Date   WBC 15.1 (H) 11/23/2024   HGB 10.3 (L) 11/23/2024   HCT 32.2 (L) 11/23/2024   MCV 85.4 11/23/2024   PLT 290 11/23/2024    Lab Results  Component Value Date   CREATININE 0.88 11/23/2024   BUN 23 11/23/2024   NA 135 11/23/2024   K 3.8 11/23/2024   CL 100 11/23/2024   CO2 22 11/23/2024    Lab Results  Component Value Date   ALT 14 11/23/2024   AST 17 11/23/2024   ALKPHOS 94 11/23/2024   BILITOT 0.6 11/23/2024       Loney Stank, MD [facility] 12/01/2024, 3:21 PM Evaluation of this patient requires complex antimicrobial therapy evaluation and counseling + isolation needs for disease transmission risk assessment and mitigation      [1]  Social History Tobacco Use   Smoking status: Former    Types: Cigarettes    Start date: 2023    Quit date: 1973    Years since quitting: 53.0   Smokeless tobacco: Never  Vaping Use   Vaping status: Never Used  Substance Use Topics   Alcohol use: Not Currently   Drug use: Not Currently  [2]  Allergies Allergen Reactions   Bee Venom Anaphylaxis  and Hives   Shellfish Allergy Anaphylaxis, Hives and Dermatitis    Pt can tolerate shrimp   Statins    Tape Itching    Adhesive   "

## 2024-12-01 NOTE — Anesthesia Procedure Notes (Signed)
 Procedure Name: Intubation Date/Time: 12/01/2024 1:53 PM  Performed by: Metta Andrea NOVAK, CRNAPre-anesthesia Checklist: Patient identified, Emergency Drugs available, Suction available, Patient being monitored and Timeout performed Patient Re-evaluated:Patient Re-evaluated prior to induction Oxygen Delivery Method: Circle system utilized Preoxygenation: Pre-oxygenation with 100% oxygen Induction Type: IV induction Ventilation: Mask ventilation without difficulty Laryngoscope Size: Glidescope and 4 Grade View: Grade I Tube type: Oral Tube size: 7.5 mm Number of attempts: 1 Airway Equipment and Method: Stylet Placement Confirmation: ETT inserted through vocal cords under direct vision, positive ETCO2 and breath sounds checked- equal and bilateral Secured at: 23 cm Tube secured with: Tape Dental Injury: Teeth and Oropharynx as per pre-operative assessment

## 2024-12-01 NOTE — Op Note (Signed)
 Orthopaedic Surgery Operative Note (CSN: [id number])  Roy Caldwell  [birth year] Date of Surgery: 12/01/2024   Diagnoses:  Infection of prosthetic shoulder joint left  Procedure: Left revision reverse total Shoulder Arthroplasty 23474 Open shoulder synovectomy 23105 I&D shoulder abscess 23040   Operative Finding Successful completion of planned procedure.  Patient had gross infection throughout the shoulder and around his graft anteriorly.  Unfortunately to the subcutaneous area.  There is no subcutaneous abscess that is not debrided.  Tract into the bicipital area distal in the arm as well as proximally along the incision.  No sign of the tracking to the scapula in the scapular hardware.  Were able to remove the tray and poly tighten the implants and the stem and replace the sphere.  Synovectomy was performed.  Drain and Prevena wound VAC were both placed.  Patient will have infectious ease consulting.  He is extremely high risk of recurrent instability as well as recurrent infection.  Post-operative plan: The patient will be NWB in sling.  The patient will be will be admitted to observation due to medical complexity, monitoring and pain management.  DVT prophylaxis not indicated as patient already on full dose anticoagulant.  Pain control with PRN pain medication preferring oral medicines.  Follow up plan will be scheduled in approximately 7 days for incision check and XR.  Physical therapy to start after 6 weeks.  Implants: Retained Tornier revive stem from previous note, locking Replaced.  +12 tray, 6 polyethylene, 42 standard glenosphere  Post-Op Diagnosis: Same Surgeons:Primary: Cristy Bonner DASEN, MD Assistants:Caroline McBane, PA-C Location: [facility] ROOM 09 Anesthesia: General with Exparel  Interscalene Antibiotics: Ancef  2g preop, Vancomycin  1000mg  locally, 1.2 g tobramycin  locally Tourniquet time: None Estimated Blood Loss: 100 Complications: None Specimens: 3 for culture, hold for  2 weeks to rule out C acnes Implants: Implant Name Type Inv. Item Serial No. Manufacturer Lot No. LRB No. Used Action  INSERT HUM AEQ STD 42 +9 12.5D - [id number] Insert INSERT HUM AEQ STD 42 +9 12.5D [id number] [employer]  Left 1 Explanted  IMPL REVERSE SHOULDER 0X3.5 - [id number] Shoulder IMPL REVERSE SHOULDER 0X3.5 [id number] [employer]  Left 1 Explanted  CAP LOCKING COCR - [id number] Cap CAP LOCKING COCR [id number] [employer]  Left 1 Explanted  GLENOID STD CANN CC 42 - [id number] Shoulder GLENOID STD CANN CC 42 [id number] [employer]  Left 1 Explanted  CAP LOCKING COCR - [id number] Cap CAP LOCKING COCR [id number] [employer]  Left 1 Implanted  GLENOID STD CANN CC 42 - [id number] Shoulder GLENOID STD CANN CC 42 [id number] [employer]  Left 1 Implanted  IMPL REVERSED SHOULDER 12T 3.5 - [id number] Shoulder IMPL REVERSED SHOULDER 12T 3.5 [id number] [employer]  Left 1 Implanted  LINER HUM AEQ 42 +6 5D - [id number] Liner LINER HUM AEQ 42 +6 5D [id number] [employer]  Left 1 Implanted    Indications for Surgery:   Roy Caldwell is a 69 y.o. male with long history well-documented previous notes with a postoperative infection.  Benefits and risks of operative and nonoperative management were discussed prior to surgery with patient/guardian(s) and informed consent form was completed.  Infection and need for further surgery were discussed as was prosthetic stability and cuff issues.  We additionally specifically discussed risks of axillary nerve injury, infection, periprosthetic fracture, continued pain and longevity of implants prior to beginning procedure.      Procedure:   The patient was identified in the preoperative holding area where the surgical site was  marked. Block placed by anesthesia with exparel .  The patient was taken to the OR where a procedural timeout was called and the above noted anesthesia was induced.  The patient was positioned beachchair on allen table with  spider arm positioner.  Preoperative antibiotics were dosed.  The patient's left shoulder was prepped and draped in the usual sterile fashion.  A second preoperative timeout was called.       Began by using the patient's previous incision.  There was an obvious abscess in the anterior shoulder that was bulging out, this was subfascial and deep in the muscle.  We debrided on having healthy material back to a stable base using a rondure and a knife.  We irrigated with 1 L of normal saline in this area.  We then tracked and clearly tracked into the joint.  We then open the joint using a deltopectoral approach.  Blunt retractors were placed.  We identified the anterior allograft that was clearly in contact with infectious material.  We remove this and any sutures that were visible.  We used a curette to clear unhealthy tissue and purulent material down to bleeding bone.  Then was able to dislocate the shoulder which was quite well reduced and quite stable initially.  I was able to remove the tray and poly and irrigate and debride around the stem.  The stem was well-fixed.  We turned our attention of the glenoid.  This was removed.  We used a curette and rongeurs to perform a complete synovectomy and debride all around the shoulder.  Purulent material tracked both proximal and distal incision we cleared these areas as well.  Prontosan solution was used by technique as described in instructions.  We then were able to continue by placing a new glenosphere.  We irrigated with 3 L normal saline in the deep layers.  We turned our attention back to the humerus.  Blunt retractors were placed.  We trialed and selected a +12 tray with a +6 polyethylene.  Final construct was quite stable.  Irrigated once more placed a medium Hemovac close incision with nonabsorbable suture and placed local vancomycin  and tobramycin  powder.  Prevena strip VAC was placed.  Sterile dressing and sling were placed.  Patient awoken taken back in  stable condition.   Aleck Stalling, PA-C, present and scrubbed throughout the case, critical for completion in a timely fashion, and for retraction, instrumentation, closure.

## 2024-12-01 NOTE — Anesthesia Postprocedure Evaluation (Signed)
"   Anesthesia Post Note  Patient: Roy Caldwell  Procedure(s) Performed: REVISION, REVERSE TOTAL ARTHROPLASTY, SHOULDER (Left: Shoulder) SYNOVECTOMY (Left: Shoulder) IRRIGATION AND DEBRIDEMENT SHOULDER (Left: Shoulder)     Patient location during evaluation: PACU Anesthesia Type: General Level of consciousness: awake and alert, oriented and patient cooperative Pain management: pain level controlled (shoulder pain controlled, completely numb, his pre-existing back pain remains extreme) Vital Signs Assessment: post-procedure vital signs reviewed and stable Respiratory status: spontaneous breathing, nonlabored ventilation and respiratory function stable Cardiovascular status: blood pressure returned to baseline and stable Postop Assessment: no apparent nausea or vomiting, able to ambulate and adequate PO intake Anesthetic complications: no   No notable events documented.  Last Vitals:  Vitals:   12/01/24 1715 12/01/24 1730  BP: (!) 123/58   Pulse: 86 84  Resp: 15 12  Temp: 37.1 C   SpO2: 96% 98%    Last Pain:  Vitals:   12/01/24 1730  TempSrc:   PainSc: 7                  Lennon Richins,E. Lianne Carreto      "

## 2024-12-01 NOTE — Progress Notes (Signed)
 Pharmacy Antibiotic Note  Roy Caldwell is a 69 y.o. 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  Pharmacy has been consulted for Vancomycin  dosing.  Cefazolin  2g given in the OR at 1354, Vancomycin  and tobramycin  powder given intra-op in the joint as well around 1500.   SCr 0.88, CrCl~80-90 ml/min.   Plan: - Start Vancomycin  1500 mg IV x 1 dose followed by 1g IV every 12 hours (eAUC 490, VT 14.6) - Will follow-up on sensitivities to adjust to targeted therapy for MSSA vs MRSA respectively, renal function for necessary dose adjustments  Height: 5' 5 (165.1 cm) Weight: 90.7 kg (200 lb) IBW/kg (Calculated) : 61.5  Temp (24hrs), Avg:99.1 F (37.3 C), Min:98.7 F (37.1 C), Max:99.4 F (37.4 C)  No results for input(s): WBC, CREATININE, LATICACIDVEN, VANCOTROUGH, VANCOPEAK, VANCORANDOM, GENTTROUGH, GENTPEAK, GENTRANDOM, TOBRATROUGH, TOBRAPEAK, TOBRARND, AMIKACINPEAK, AMIKACINTROU, AMIKACIN in the last 168 hours.  Estimated Creatinine Clearance: 83.2 mL/min (by C-G formula based on SCr of 0.88 mg/dL).    Allergies[1]  Antimicrobials this admission: Vancomycin  1/21 >>  Dose adjustments this admission:   Microbiology results: OR cx pending  Thank you for allowing pharmacy to be a part of this patients care.  Almarie Lunger, PharmD, BCPS, BCIDP Infectious Diseases Clinical Pharmacist 12/01/2024 3:47 PM   **Pharmacist phone directory can now be found on [email or web address] ([email or web address]).  Listed under [facility].     [1]  Allergies Allergen Reactions   Bee Venom Anaphylaxis and Hives   Shellfish Allergy Anaphylaxis, Hives and Dermatitis    Pt can tolerate shrimp   Statins    Tape Itching    Adhesive

## 2024-12-01 NOTE — Interval H&P Note (Signed)
 All questions answered, patient wants to proceed with procedure. ? ?

## 2024-12-01 NOTE — Transfer of Care (Signed)
 Immediate Anesthesia Transfer of Care Note  Patient: Roy Caldwell  Procedure(s) Performed: REVISION, REVERSE TOTAL ARTHROPLASTY, SHOULDER (Left: Shoulder) SYNOVECTOMY (Left: Shoulder) IRRIGATION AND DEBRIDEMENT SHOULDER (Left: Shoulder)  Patient Location: PACU  Anesthesia Type:General  Level of Consciousness: awake, alert , and oriented  Airway & Oxygen Therapy: Patient Spontanous Breathing and Patient connected to face mask oxygen  Post-op Assessment: Report given to RN, Post -op Vital signs reviewed and stable, and Patient moving all extremities X 4  Post vital signs: Reviewed and stable  Last Vitals:  Vitals Value Taken Time  BP 120/64 12/01/24 15:31  Temp    Pulse 90 12/01/24 15:34  Resp 21 12/01/24 15:34  SpO2 95 % 12/01/24 15:34  Vitals shown include unfiled device data.  Last Pain:  Vitals:   12/01/24 1245  TempSrc:   PainSc: 5       Patients Stated Pain Goal: 4 (12/01/24 1106)  Complications: No notable events documented.

## 2024-12-01 NOTE — Anesthesia Preprocedure Evaluation (Addendum)
"                                    Anesthesia Evaluation  Patient identified by MRN, date of birth, ID band Patient awake    Reviewed: Allergy & Precautions, NPO status , Patient's Chart, lab work & pertinent test results  History of Anesthesia Complications Negative for: history of anesthetic complications  Airway Mallampati: I  TM Distance: >3 FB Neck ROM: Full    Dental  (+) Dental Advisory Given   Pulmonary COPD,  COPD inhaler, former smoker   breath sounds clear to auscultation       Cardiovascular hypertension, (-) angina + CAD, + Cardiac Stents ((DES LAD, RCA 05/25/2021)) and + Peripheral Vascular Disease   Rhythm:Regular Rate:Normal  03/2024 Stress: 1.  Small apical/apicoseptal infarct.  No ischemia..  2. Normal left ventricular wall motion.  3. Left ventricular ejection fraction 76%  4. Non invasive risk stratification*: Low   '22 ECHO:  The left ventricular size is normal. Left ventricular systolic  function is low normal. LV EF = 50-55%. Left  ventricular filling pattern is prolonged relaxation. Mitral inflow  deceleration timeNormal>150 msec. There is basal LV inferior wall  hypokinesis. There is mid LV inferior wall hypokinesis  No significant valvular abnormalities    Neuro/Psych TIA   GI/Hepatic Neg liver ROS,GERD  Medicated and Controlled,,  Endo/Other  BMI 33  Renal/GU Renal InsufficiencyRenal disease     Musculoskeletal   Abdominal   Peds  Hematology Plavix  Hb 10.3, plt 290k   Anesthesia Other Findings   Reproductive/Obstetrics                              Anesthesia Physical Anesthesia Plan  ASA: 3  Anesthesia Plan: General   Post-op Pain Management: Regional block* and Tylenol  PO (pre-op)*   Induction: Intravenous  PONV Risk Score and Plan: 2 and Ondansetron  and Dexamethasone   Airway Management Planned: Oral ETT  Additional Equipment: None  Intra-op Plan:   Post-operative Plan:  Extubation in OR  Informed Consent: I have reviewed the patients History and Physical, chart, labs and discussed the procedure including the risks, benefits and alternatives for the proposed anesthesia with the patient or authorized representative who has indicated his/her understanding and acceptance.     Dental advisory given  Plan Discussed with: CRNA and Surgeon  Anesthesia Plan Comments: (Plan routine monitors, GETA with interscalene brachial plexus block for post op analgesia)         Anesthesia Quick Evaluation  "

## 2024-12-02 ENCOUNTER — Other Ambulatory Visit: Payer: Self-pay

## 2024-12-02 ENCOUNTER — Encounter (HOSPITAL_COMMUNITY): Payer: Self-pay | Admitting: Orthopaedic Surgery

## 2024-12-02 DIAGNOSIS — B957 Other staphylococcus as the cause of diseases classified elsewhere: Secondary | ICD-10-CM | POA: Diagnosis not present

## 2024-12-02 DIAGNOSIS — T8459XS Infection and inflammatory reaction due to other internal joint prosthesis, sequela: Secondary | ICD-10-CM | POA: Diagnosis not present

## 2024-12-02 LAB — BASIC METABOLIC PANEL WITH GFR
Anion gap: 9 (ref 5–15)
BUN: 18 mg/dL (ref 8–23)
CO2: 22 mmol/L (ref 22–32)
Calcium: 7.9 mg/dL — ABNORMAL LOW (ref 8.9–10.3)
Chloride: 102 mmol/L (ref 98–111)
Creatinine, Ser: 0.71 mg/dL (ref 0.61–1.24)
GFR, Estimated: >60 mL/min
Glucose, Bld: 137 mg/dL — ABNORMAL HIGH (ref 70–99)
Potassium: 4.6 mmol/L (ref 3.5–5.1)
Sodium: 133 mmol/L — ABNORMAL LOW (ref 135–145)

## 2024-12-02 LAB — CBC
HCT: 27.4 % — ABNORMAL LOW (ref 39.0–52.0)
Hemoglobin: 8.8 g/dL — ABNORMAL LOW (ref 13.0–17.0)
MCH: 27 pg (ref 26.0–34.0)
MCHC: 32.1 g/dL (ref 30.0–36.0)
MCV: 84 fL (ref 80.0–100.0)
Platelets: 340 K/uL (ref 150–400)
RBC: 3.26 MIL/uL — ABNORMAL LOW (ref 4.22–5.81)
RDW: 15.7 % — ABNORMAL HIGH (ref 11.5–15.5)
WBC: 18.5 K/uL — ABNORMAL HIGH (ref 4.0–10.5)
nRBC: 0 % (ref 0.0–0.2)

## 2024-12-02 LAB — CK: Total CK: 61 U/L (ref 49–397)

## 2024-12-02 MED ORDER — SODIUM CHLORIDE 0.9% FLUSH: 10.0000 mL | INTRAVENOUS | Status: DC | PRN

## 2024-12-02 MED ORDER — DAPTOMYCIN-SODIUM CHLORIDE 700-0.9 MG/100ML-% IV SOLN
8.0000 mg/kg | Freq: Every day | INTRAVENOUS | Status: DC
  Administered 2024-12-02 – 2024-12-03 (×2): 700 mg via INTRAVENOUS
  Filled 2024-12-02 (×2): qty 100

## 2024-12-02 MED ORDER — SODIUM CHLORIDE 0.9% FLUSH
10.0000 mL | Freq: Two times a day (BID) | INTRAVENOUS | Status: DC
  Administered 2024-12-02 – 2024-12-03 (×2): 10 mL

## 2024-12-02 MED ORDER — CHLORHEXIDINE GLUCONATE CLOTH 2 % EX PADS
6.0000 | MEDICATED_PAD | Freq: Every day | CUTANEOUS | Status: DC
  Administered 2024-12-02 – 2024-12-03 (×2): 6 via TOPICAL

## 2024-12-02 NOTE — Progress Notes (Signed)
 "        [facility]  Date of Admission:  12/01/2024   Total days of inpatient antibiotics 1  Principal Problem:   Prosthetic joint infection          Assessment: 69 year old male with past medical history of hypertension, hyperlipidemia, COPD, CKD, CVA presented with shoulder infection found to have #Left shoulder PJI status post left revision reverse total shoulder arthroplasty, open shoulder synovectomy, I&D shoulder abscess with poly exchange - Patient has had multiple revision surgeries with prior negative cultures.SABRA  He underwent left reverse total shoulder with Dr. Cristy on 09/22/2024 then began to have progressively worsening left  shoulder pain.  On 1/15 he received IM injection   In Ortho clinic on 1/19 patient underwent ultrasound-guided aspiration of possible fluid collection of the left shoulder yielding 22 cc of purulent drainage.  Cultures have grown Staph aureus with sensitivities pending.  US  showed well-circumscribed size hypoechoic fluid collection that appears superficial to the pectoralis major muscle.Taken to the OR for procedure above.  ID engaged for antibiotic recommendations - Of note patient had a ED visit on 1/13 for itchy rash, noted he has done some plugging on a house recently with rat droppings.  He was given epinephrine  and prednisone .  WBC 15K at that time. -Amoxicillin  was filled on 11/29/2018 - Today patient states that index replacement was back in 2024 with Ortho in [facility].  He has had interventions since that time. - Cultures from aspiration end of office showing Staph aureus resistant to oxacillin, sensitive to everything else.  Sensitivities in progress note on 12/02/2024 Recommendations: - Continue vancomycin  - Follow or cultures, Gram stain showed GPC I suspect the same strain of MRSA is in clinic - Place PICC - Anticipate 6 weeks of IV antibiotics followed by p.o. suppression(given MRSA return hardware suspect he will  need indefinite suppression).  I discussed this with patient and wife at bedside. - Standard precautions -Plan communicated primary   Microbiology:   Antibiotics: Vancomycin    Cultures: Blood   Urine   Other OR cultures 01/21-Gram stain GPC   SUBJECTIVE: Resting in bed.  Wife at bedside. Interval: Afebrile overnight.  Review of Systems: Review of Systems  All other systems reviewed and are negative.    Scheduled Meds:  alfuzosin   10 mg Oral Daily   cilostazol   50 mg Oral BID   clopidogrel   75 mg Oral Daily   docusate sodium   100 mg Oral BID   enoxaparin  (LOVENOX ) injection  40 mg Subcutaneous Q24H   ezetimibe   10 mg Oral QPM   finasteride   5 mg Oral Daily   naproxen   250 mg Oral BID WC   pantoprazole   40 mg Oral Daily   Continuous Infusions:  lactated ringers  Stopped (12/01/24 1832)   vancomycin  1,000 mg (12/02/24 0927)   PRN Meds:.acetaminophen , alum & mag hydroxide-simeth, bisacodyl , diphenhydrAMINE , famotidine , HYDROmorphone  (DILAUDID ) injection, magnesium  citrate, menthol  **OR** phenol, methocarbamol  **OR** methocarbamol  (ROBAXIN ) injection, metoCLOPramide  **OR** metoCLOPramide  (REGLAN ) injection, nitroGLYCERIN , ondansetron  **OR** ondansetron  (ZOFRAN ) IV, mouth rinse, oxyCODONE , oxyCODONE , polyethylene glycol, zolpidem  Allergies[1]  OBJECTIVE: Vitals:   12/02/24 0151 12/02/24 0556 12/02/24 0900 12/02/24 1336  BP: 129/67 138/71 137/65 125/61  Pulse: 73 75 62 73  Resp: 18 17 18 20   Temp: 97.9 F (36.6 C) 98.1 F (36.7 C) 98.1 F (36.7 C) 97.8 F (36.6 C)  TempSrc:    Oral  SpO2: 97% 97% 97% 98%  Weight:      Height:  Body mass index is 33.28 kg/m.  Physical Exam Constitutional:      General: He is not in acute distress.    Appearance: He is normal weight. He is not toxic-appearing.  HENT:     Head: Normocephalic and atraumatic.     Right Ear: External ear normal.     Left Ear: External ear normal.     Nose: No congestion or rhinorrhea.      Mouth/Throat:     Mouth: Mucous membranes are moist.     Pharynx: Oropharynx is clear.  Eyes:     Extraocular Movements: Extraocular movements intact.     Conjunctiva/sclera: Conjunctivae normal.     Pupils: Pupils are equal, round, and reactive to light.  Cardiovascular:     Rate and Rhythm: Normal rate and regular rhythm.     Heart sounds: No murmur heard.    No friction rub. No gallop.  Pulmonary:     Effort: Pulmonary effort is normal.     Breath sounds: Normal breath sounds.  Abdominal:     General: Abdomen is flat. Bowel sounds are normal.     Palpations: Abdomen is soft.  Musculoskeletal:     Cervical back: Normal range of motion and neck supple.     Comments: Wound vac on shoudler  Skin:    General: Skin is warm and dry.  Neurological:     General: No focal deficit present.     Mental Status: He is oriented to person, place, and time.  Psychiatric:        Mood and Affect: Mood normal.       Lab Results Lab Results  Component Value Date   WBC 18.5 (H) 12/02/2024   HGB 8.8 (L) 12/02/2024   HCT 27.4 (L) 12/02/2024   MCV 84.0 12/02/2024   PLT 340 12/02/2024    Lab Results  Component Value Date   CREATININE 0.71 12/02/2024   BUN 18 12/02/2024   NA 133 (L) 12/02/2024   K 4.6 12/02/2024   CL 102 12/02/2024   CO2 22 12/02/2024    Lab Results  Component Value Date   ALT 14 11/23/2024   AST 17 11/23/2024   ALKPHOS 94 11/23/2024   BILITOT 0.6 11/23/2024        Loney Stank, MD [facility] 12/02/2024, 1:43 PM Evaluation of this patient requires complex antimicrobial therapy evaluation and counseling + isolation needs for disease transmission risk assessment and mitigation      [1]  Allergies Allergen Reactions   Bee Venom Anaphylaxis and Hives   Shellfish Allergy Anaphylaxis, Hives and Dermatitis    Pt can tolerate shrimp   Statins    Tape Itching    Adhesive   "

## 2024-12-02 NOTE — Progress Notes (Signed)
" ° °  Culture results from in office aspiration performed on 11/28/24  Aleck Stalling, PA-C 12/02/24 "

## 2024-12-02 NOTE — Progress Notes (Signed)
 Peripherally Inserted Central Catheter Placement  The IV Nurse has discussed with the patient and/or persons authorized to consent for the patient, the purpose of this procedure and the potential benefits and risks involved with this procedure.  The benefits include less needle sticks, lab draws from the catheter, and the patient may be discharged home with the catheter. Risks include, but not limited to, infection, bleeding, blood clot (thrombus formation), and puncture of an artery; nerve damage and irregular heartbeat and possibility to perform a PICC exchange if needed/ordered by physician.  Alternatives to this procedure were also discussed.  Bard Power PICC patient education guide, fact sheet on infection prevention and patient information card has been provided to patient /or left at bedside.    PICC Placement Documentation  PICC Single Lumen 12/02/24 Right Basilic 37 cm 0 cm (Active)  Indication for Insertion or Continuance of Line Prolonged intravenous therapies 12/02/24 1615  Exposed Catheter (cm) 0 cm 12/02/24 1615  Site Assessment Clean, Dry, Intact 12/02/24 1615  Line Status Flushed;Blood return noted;Saline locked 12/02/24 1615  Dressing Type Transparent 12/02/24 1615  Dressing Status Antimicrobial disc/dressing in place 12/02/24 1615  Line Care Connections checked and tightened 12/02/24 1615  Line Adjustment (NICU/IV Team Only) No 12/02/24 1615  Dressing Intervention New dressing 12/02/24 1615  Dressing Change Due 12/09/24 12/02/24 1615       Roy Caldwell 12/02/2024, 4:16 PM

## 2024-12-02 NOTE — Progress Notes (Signed)
 PHARMACY CONSULT NOTE FOR:  OUTPATIENT  PARENTERAL ANTIBIOTIC THERAPY (OPAT)  Indication: MRSA L-shoulder PJI Regimen: Daptomycin  600 mg IV every 24 hours End date: 01/12/25  IV antibiotic discharge orders are pended. To discharging provider:  please sign these orders via discharge navigator,  Select New Orders & click on the button choice - Manage This Unsigned Work.     Thank you for allowing pharmacy to be a part of this patients care.  Almarie Lunger, PharmD, BCPS, BCIDP Infectious Diseases Clinical Pharmacist 12/02/2024 3:37 PM   **Pharmacist phone directory can now be found on [email or web address] ([email or web address]).  Listed under [facility].

## 2024-12-02 NOTE — Plan of Care (Signed)
  Problem: Education: Goal: Knowledge of General Education information will improve Description: Including pain rating scale, medication(s)/side effects and non-pharmacologic comfort measures Outcome: Progressing   Problem: Activity: Goal: Risk for activity intolerance will decrease Outcome: Progressing   Problem: Pain Managment: Goal: General experience of comfort will improve and/or be controlled Outcome: Progressing

## 2024-12-02 NOTE — TOC Initial Note (Signed)
 Transition of Care [facility]) - Initial/Assessment Note    Patient Details  Name: Roy Caldwell MRN: [id number] Date of Birth: February 25, 1956  Transition of Care [facility]) CM/SW Contact:    Heather DELENA Saltness, LCSW Phone Number: 12/02/2024, 1:58 PM  Clinical Narrative:                 Pt admitted to the hospital for revision of left reverse total shoulder due to Infection of prosthetic shoulder joint. Pt discharge home with IV antibiotics. CSW sent referral to Holley Herring at [place]. Pt discharge with OPPT follow up. TOC will continue to follow.     Expected Discharge Plan: Home/Self Care Barriers to Discharge: Continued Medical Work up   Patient Goals and CMS Choice Patient states their goals for this hospitalization and ongoing recovery are:: To return home   Choice offered to / list presented to : NA [place] ownership interest in [facility].provided to:: Parent NA    Expected Discharge Plan and Services In-house Referral: Clinical Social Work Discharge Planning Services: NA   Living arrangements for the past 2 months: Single Family Home                 DME Arranged: N/A DME Agency: NA       HH Arranged: RN, IV Antibiotics HH Agency: [facility] Date HH Agency Contacted: 12/02/24 Time HH Agency Contacted: 1357 Representative spoke with at [facility] Agency: Holley Herring  Prior Living Arrangements/Services Living arrangements for the past 2 months: Single Family Home Lives with:: Spouse Patient language and need for interpreter reviewed:: Yes Do you feel safe going back to the place where you live?: Yes      Need for Family Participation in Patient Care: Yes (Comment) Care giver support system in place?: Yes (comment)   Criminal Activity/Legal Involvement Pertinent to Current Situation/Hospitalization: No - Comment as needed  Activities of Daily Living   ADL Screening (condition at time of admission) Independently performs ADLs?: Yes (appropriate for developmental  age) Is the patient deaf or have difficulty hearing?: No Does the patient have difficulty seeing, even when wearing glasses/contacts?: No Does the patient have difficulty concentrating, remembering, or making decisions?: No  Permission Sought/Granted Permission sought to share information with : Case Manager, Family Supports Permission granted to share information with : Yes, Verbal Permission Granted  Share Information with NAME: Arbor Cohen  Permission granted to share info w AGENCY: [facility]  Permission granted to share info w Relationship: Spouse  Permission granted to share info w Contact Information: [phone]  Emotional Assessment Appearance:: Appears stated age Attitude/Demeanor/Rapport: Engaged Affect (typically observed): Stable Orientation: : Oriented to Self, Oriented to Place, Oriented to  Time, Oriented to Situation Alcohol / Substance Use: Not Applicable Psych Involvement: No (comment)  Admission diagnosis:  Infection of prosthetic shoulder joint, initial encounter [T84.59XA, Z96.619] Presence of left artificial shoulder joint [Z96.612] 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] Patient Active Problem List   Diagnosis Date Noted   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   PCP:  Joshua Franky Sharper, PA-C Pharmacy:   [id number] - [place], [place] - [address] [place] [address] Phone: [phone] Fax: [phone]   Social Drivers of Health (SDOH) Social History: SDOH Screenings   Food Insecurity: No Food Insecurity (12/01/2024)  Housing: Low Risk (12/01/2024)  Transportation Needs: No Transportation Needs (12/01/2024)  Utilities: Not At  Risk (12/01/2024)  Social Connections: Socially Integrated (12/01/2024)  Tobacco Use: Medium Risk (12/01/2024)   SDOH Interventions: None     Readmission Risk Interventions    12/02/2024     1:51 PM 07/08/2024   10:36 AM  Readmission Risk Prevention Plan  Post Dischage Appt  Complete  Medication Screening  Complete  Transportation Screening Complete Complete  PCP or Specialist Appt within 5-7 Days Complete   Home Care Screening Complete   Medication Review (RN CM) Complete     Signed: Heather Saltness, MSW, LCSW Clinical Social Worker Inpatient Care Management 12/02/2024 2:01 PM
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
## 2024-12-03 DIAGNOSIS — T8459XS Infection and inflammatory reaction due to other internal joint prosthesis, sequela: Secondary | ICD-10-CM | POA: Diagnosis not present

## 2024-12-03 LAB — CBC
HCT: 27 % — ABNORMAL LOW (ref 39.0–52.0)
Hemoglobin: 8.2 g/dL — ABNORMAL LOW (ref 13.0–17.0)
MCH: 26.4 pg (ref 26.0–34.0)
MCHC: 30.4 g/dL (ref 30.0–36.0)
MCV: 86.8 fL (ref 80.0–100.0)
Platelets: 337 K/uL (ref 150–400)
RBC: 3.11 MIL/uL — ABNORMAL LOW (ref 4.22–5.81)
RDW: 15.6 % — ABNORMAL HIGH (ref 11.5–15.5)
WBC: 13.6 K/uL — ABNORMAL HIGH (ref 4.0–10.5)
nRBC: 0 % (ref 0.0–0.2)

## 2024-12-03 MED ORDER — DAPTOMYCIN IV (FOR PTA / DISCHARGE USE ONLY): 600.0000 mg | INTRAVENOUS | 0 refills | Status: AC

## 2024-12-03 MED ORDER — HEPARIN SOD (PORK) LOCK FLUSH 100 UNIT/ML IV SOLN
250.0000 [IU] | INTRAVENOUS | Status: AC | PRN
  Administered 2024-12-03: 250 [IU]

## 2024-12-03 NOTE — Discharge Instructions (Signed)
 Bonner Hair MD, MPH Aleck Stalling, PA-C [facility] [address] [phone] (tel)   [phone] (fax)   POST-OPERATIVE INSTRUCTIONS - TOTAL SHOULDER REPLACEMENT    WOUND CARE You may leave the operative dressing in place until your follow-up appointment. KEEP THE INCISIONS CLEAN AND DRY. There may be a small amount of fluid/bleeding leaking at the surgical site. This is normal after surgery.  If it fills with liquid or blood please call us  immediately to change it for you. Use the provided ice machine or Ice packs as often as possible for the first 3-4 days, then as needed for pain relief.   Keep a layer of cloth or a shirt between your skin and the cooling unit to prevent frost bite as it can get very cold.  SHOWERING: - You may take a bird bath on Post-Op Day #2.  - Do not soak the shoulder in water .  - Do not go swimming in the pool or ocean until your incision has completely healed (about 4-6 weeks after surgery) - KEEP THE INCISIONS CLEAN AND DRY.  - Keep wound vac in place  EXERCISES Wear the sling at all times   It is normal for your fingers/hand to become more swollen after surgery and discolored from bruising.   This will resolve over the first few weeks usually after surgery. Please continue to ambulate and do not stay sitting or lying for too long.  Perform foot and wrist pumps to assist in circulation.  PHYSICAL THERAPY - No therapy for 6 weeks  FOLLOW-UP If you develop a Fever (>101.5), Redness or Drainage from the surgical incision site, please call our office to arrange for an evaluation. Please call the office to schedule a follow-up appointment for a wound check, 7-10 days post-operatively. We will call you to arrange follow up on Wednesday 11/26 for drain removal  IF YOU HAVE ANY QUESTIONS, PLEASE FEEL FREE TO CALL OUR OFFICE.  HELPFUL INFORMATION  Your arm will be in a sling following surgery. You will be in this  sling for the next 4-6 weeks.   You may be more comfortable sleeping in a semi-seated position the first few nights following surgery.  Keep a pillow propped under the elbow and forearm for comfort.  If you have a recliner type of chair it might be beneficial.  If not that is fine too, but it would be helpful to sleep propped up with pillows behind your operated shoulder as well under your elbow and forearm.  This will reduce pulling on the suture lines.  When dressing, put your operative arm in the sleeve first.  When getting undressed, take your operative arm out last.  Loose fitting, button-down shirts are recommended.  Wear a shirt over your sling to prevent movement in your arm  In most states it is against the law to drive while your arm is in a sling. And certainly against the law to drive while taking narcotics.  You may return to work/school in the next couple of days when you feel up to it. Desk work and typing in the sling is fine.  We suggest you use the pain medication the first night prior to going to bed, in order to ease any pain when the anesthesia wears off. You should avoid taking pain medications on an empty stomach as it will make you nauseous.  You should wean off your narcotic medicines as soon as you are able.     Most  patients will be off narcotics before their first postop appointment.   Do not drink alcoholic beverages or take illicit drugs when taking pain medications.  Pain medication may make you constipated.  Below are a few solutions to try in this order: Decrease the amount of pain medication if you arent having pain. Drink lots of decaffeinated fluids. Drink prune juice and/or each dried prunes  If the first 3 dont work start with additional solutions Take Colace - an over-the-counter stool softener Take Senokot - an over-the-counter laxative Take Miralax  - a stronger over-the-counter laxative   Dental Antibiotics:  We require dental prophylaxis for  2 years after a shoulder replacement  Contact your surgeon for an antibiotic prescription, prior to your dental procedure.   For more information including helpful videos and documents visit our website:   [email or web address]

## 2024-12-03 NOTE — TOC Transition Note (Signed)
 Transition of Care [facility]) - Discharge Note   Patient Details  Name: Roy Caldwell MRN: [id number] Date of Birth: [birth year]  Transition of Care [facility]) CM/SW Contact:  Sonda Manuella Quill, RN Phone Number: 12/03/2024, 2:06 PM   Clinical Narrative:    D/C orders received; [facility] per [place]; [facility] for home IV abx; pt/spouse notified and agreed to d/c plan; agency contact info placed in follow up provider section of d/c instructions; no IP CM needs.   Final next level of care: Home w Home Health Services Barriers to Discharge: No Barriers Identified   Patient Goals and CMS Choice Patient states their goals for this hospitalization and ongoing recovery are:: To return home   Choice offered to / list presented to : NA [place] ownership interest in [facility].provided to:: Parent NA    Discharge Placement                       Discharge Plan and Services Additional resources added to the After Visit Summary for   In-house Referral: Clinical Social Work Discharge Planning Services: NA            DME Arranged: N/A DME Agency: NA       HH Arranged: RN HH Agency: [employer] Date HH Agency Contacted: 12/03/24 Time HH Agency Contacted: 1303 Representative spoke with at [facility] Agency: HHRN accepted by [facility] in [employer]  Social Drivers of Health (SDOH) Interventions SDOH Screenings   Food Insecurity: No Food Insecurity (12/01/2024)  Housing: Low Risk (12/01/2024)  Transportation Needs: No Transportation Needs (12/01/2024)  Utilities: Not At Risk (12/01/2024)  Social Connections: Socially Integrated (12/01/2024)  Tobacco Use: Medium Risk (12/01/2024)     Readmission Risk Interventions    12/02/2024    1:51 PM 07/08/2024   10:36 AM  Readmission Risk Prevention Plan  Post Dischage Appt  Complete  Medication Screening  Complete  Transportation Screening Complete Complete  PCP or Specialist Appt within 5-7 Days Complete   Home Care Screening Complete    Medication Review (RN CM) Complete

## 2024-12-03 NOTE — Plan of Care (Signed)
" °  Problem: Cardiac: Goal: Ability to maintain an adequate cardiac output Outcome: Adequate for Discharge Goal: Will show no evidence of cardiac arrhythmias Outcome: Adequate for Discharge   Problem: Clinical Measurements: Goal: Ability to maintain clinical measurements within normal limits Outcome: Adequate for Discharge Goal: Postoperative complications will be avoided or minimized Outcome: Adequate for Discharge   Problem: Skin Integrity: Goal: Demonstrates signs of wound healing without infection Outcome: Adequate for Discharge   Problem: Urinary Elimination: Goal: Will remain free from infection Outcome: Adequate for Discharge   Problem: Education: Goal: Knowledge of General Education information will improve Description: Including pain rating scale, medication(s)/side effects and non-pharmacologic comfort measures Outcome: Adequate for Discharge   Problem: Health Behavior/Discharge Planning: Goal: Ability to manage health-related needs will improve Outcome: Adequate for Discharge   Problem: Clinical Measurements: Goal: Ability to maintain clinical measurements within normal limits will improve Outcome: Adequate for Discharge Goal: Will remain free from infection Outcome: Adequate for Discharge Goal: Diagnostic test results will improve Outcome: Adequate for Discharge Goal: Respiratory complications will improve Outcome: Adequate for Discharge Goal: Cardiovascular complication will be avoided Outcome: Adequate for Discharge   Problem: Activity: Goal: Risk for activity intolerance will decrease Outcome: Adequate for Discharge   Problem: Coping: Goal: Level of anxiety will decrease Outcome: Adequate for Discharge   Problem: Pain Managment: Goal: General experience of comfort will improve and/or be controlled Outcome: Adequate for Discharge   Problem: Safety: Goal: Ability to remain free from injury will improve Outcome: Adequate for Discharge   Problem: Skin  Integrity: Goal: Risk for impaired skin integrity will decrease Outcome: Adequate for Discharge   Problem: Education: Goal: Knowledge of the prescribed therapeutic regimen will improve Outcome: Adequate for Discharge Goal: Understanding of activity limitations/precautions following surgery will improve Outcome: Adequate for Discharge Goal: Individualized Educational Video(s) Outcome: Adequate for Discharge   Problem: Activity: Goal: Ability to tolerate increased activity will improve Outcome: Adequate for Discharge   Problem: Pain Management: Goal: Pain level will decrease with appropriate interventions Outcome: Adequate for Discharge   "

## 2024-12-03 NOTE — Plan of Care (Signed)
" °  Problem: Bowel/Gastric: Goal: Gastrointestinal status for postoperative course will improve Outcome: Completed/Met   Problem: Cardiac: Goal: Ability to maintain an adequate cardiac output Outcome: Adequate for Discharge Goal: Will show no evidence of cardiac arrhythmias Outcome: Adequate for Discharge   Problem: Nutritional: Goal: Will attain and maintain optimal nutritional status Outcome: Completed/Met   Problem: Neurological: Goal: Will regain or maintain usual level of consciousness Outcome: Completed/Met   Problem: Clinical Measurements: Goal: Ability to maintain clinical measurements within normal limits Outcome: Progressing Goal: Postoperative complications will be avoided or minimized Outcome: Progressing   Problem: Respiratory: Goal: Will regain and/or maintain adequate ventilation Outcome: Completed/Met Goal: Respiratory status will improve Outcome: Completed/Met   Problem: Skin Integrity: Goal: Demonstrates signs of wound healing without infection Outcome: Progressing   Problem: Urinary Elimination: Goal: Will remain free from infection Outcome: Progressing Goal: Ability to achieve and maintain adequate urine output Outcome: Completed/Met   Problem: Education: Goal: Knowledge of General Education information will improve Description: Including pain rating scale, medication(s)/side effects and non-pharmacologic comfort measures Outcome: Adequate for Discharge   Problem: Health Behavior/Discharge Planning: Goal: Ability to manage health-related needs will improve Outcome: Progressing   Problem: Clinical Measurements: Goal: Ability to maintain clinical measurements within normal limits will improve Outcome: Progressing Goal: Will remain free from infection Outcome: Progressing Goal: Diagnostic test results will improve Outcome: Progressing Goal: Respiratory complications will improve Outcome: Adequate for Discharge Goal: Cardiovascular complication  will be avoided Outcome: Adequate for Discharge   Problem: Activity: Goal: Risk for activity intolerance will decrease Outcome: Adequate for Discharge   Problem: Nutrition: Goal: Adequate nutrition will be maintained Outcome: Completed/Met   Problem: Coping: Goal: Level of anxiety will decrease Outcome: Progressing   Problem: Elimination: Goal: Will not experience complications related to bowel motility Outcome: Completed/Met Goal: Will not experience complications related to urinary retention Outcome: Completed/Met   Problem: Pain Managment: Goal: General experience of comfort will improve and/or be controlled Outcome: Adequate for Discharge   Problem: Safety: Goal: Ability to remain free from injury will improve Outcome: Progressing   Problem: Skin Integrity: Goal: Risk for impaired skin integrity will decrease Outcome: Progressing   "

## 2024-12-03 NOTE — TOC Progression Note (Addendum)
 Transition of Care [facility]) - Progression Note    Patient Details  Name: Roy Caldwell MRN: [id number] Date of Birth: [birth year]  [facility]) CM/SW Contact  Sonda Manuella Quill, RN Phone Number: 12/03/2024, 11:47 AM  Clinical Narrative:    Orders received for [facility]; spoke w/ pt and spouse Roy Caldwell in room; they agree to recc services; they do not have agency preference; referral sent via hub; also Pam from [employer] is coming to provide education she around 2 PM; awaiting offers.  -1300- pt accepted by [facility] in hub; pt/spouse notified; agency contact info placed in follow up provider section of d/c instructions  Expected Discharge Plan: Home/Self Care Barriers to Discharge: Continued Medical Work up               Expected Discharge Plan and Services In-house Referral: Clinical Social Work Discharge Planning Services: NA   Living arrangements for the past 2 months: Single Family Home                 DME Arranged: N/A DME Agency: NA       HH Arranged: RN, IV Antibiotics HH Agency: [facility] Date HH Agency Contacted: 12/02/24 Time HH Agency Contacted: 1357 Representative spoke with at [facility] Agency: Holley Herring   Social Drivers of Health (SDOH) Interventions SDOH Screenings   Food Insecurity: No Food Insecurity (12/01/2024)  Housing: Low Risk (12/01/2024)  Transportation Needs: No Transportation Needs (12/01/2024)  Utilities: Not At Risk (12/01/2024)  Social Connections: Socially Integrated (12/01/2024)  Tobacco Use: Medium Risk (12/01/2024)    Readmission Risk Interventions    12/02/2024    1:51 PM 07/08/2024   10:36 AM  Readmission Risk Prevention Plan  Post Dischage Appt  Complete  Medication Screening  Complete  Transportation Screening Complete Complete  PCP or Specialist Appt within 5-7 Days Complete   Home Care Screening Complete   Medication Review (RN CM) Complete

## 2024-12-03 NOTE — Progress Notes (Signed)
 Nurse reviewed discharge instructions with pt. Pt and wife verbalized understanding of discharge instructions, follow up appointments and new medications.  Pt and wife educated on preevena wound vac and pt switched over.  No concerns at time of discharge.

## 2024-12-03 NOTE — Progress Notes (Signed)
 "  ORTHOPAEDIC PROGRESS NOTE  s/p Procedures: REVISION, REVERSE TOTAL ARTHROPLASTY, SHOULDER SYNOVECTOMY IRRIGATION AND DEBRIDEMENT SHOULDER  SUBJECTIVE: Reports pain is tolerable and to be expected. He is ready to go home.   No chest pain. No SOB. No nausea/vomiting. No other complaints.  OBJECTIVE: PE: General: resting in hospital bed, NAD LUE: wound vac in place with no drainage in canister, hemovac with small amount of drainage, hemovac removed, gauze and tegaderm placed over the opening, sling well fitting,  full and painless ROM throughout hand with DPC of 0. + Motor in  AIN, PIN, Ulnar distributions. Axillary nerve sensation with slight numbness reported.  Sensation intact in medial, radial, and ulnar distributions. Well perfused digits.    Vitals:   12/02/24 2009 12/03/24 0555  BP: 113/61 115/64  Pulse: 81 71  Resp: 16 16  Temp: 98 F (36.7 C) 98.2 F (36.8 C)  SpO2: 98% 98%    Opiates Today (MME): Today's  total administered Morphine Milligram Equivalents: 15 Opiates Yesterday (MME): Yesterday's total administered Morphine Milligram Equivalents: 72.5 Opiates Used in the last two days:  Inpatient Morphine Milligram Equivalents Per Day 1/21 - 1/23   Values displayed are in units of MME/Day    Order Start / End Date 1/21 Yesterday Today    oxyCODONE  (Oxy IR/ROXICODONE ) immediate release tablet 5 mg 1/21 - 1/21 7.5 of Unknown -- --    oxyCODONE  (ROXICODONE ) 5 MG/5ML solution 5 mg 1/21 - 1/21 0 of Unknown -- --      Group total: 7.5 of Unknown      HYDROmorphone  (DILAUDID ) injection 0.25-0.5 mg 1/21 - 1/21 40 of 40-80 -- --    meperidine  (DEMEROL ) injection 6.25-12.5 mg 1/21 - 1/21 0 of 7.5-15 -- --    fentaNYL  (SUBLIMAZE ) injection 25-100 mcg 1/21 - 1/21 15 of 7.5-30 -- --    fentaNYL  (SUBLIMAZE ) injection 1/21 - 1/21 *60 of 60 -- --    HYDROmorphone  (DILAUDID ) injection 0.5-1 mg 1/21 - No end date 0 of 20-40 20 of 60-120 0 of 60-120    oxyCODONE  (Oxy IR/ROXICODONE )  immediate release tablet 5-10 mg 1/21 - No end date 15 of 15-30 52.5 of 45-90 0 of 45-90    oxyCODONE  (Oxy IR/ROXICODONE ) immediate release tablet 10-15 mg 1/21 - No end date 0 of 30-45 0 of 90-135 15 of 90-135    HYDROmorphone  (DILAUDID ) injection 0.5 mg 1/21 - 1/21 0 of 40 -- --    Daily Totals  * 137.5 of Unknown (at least 220-340) 72.5 of 195-345 15 of 195-345  *One-Step medication  Calculation Errors     Order Type Date Details   oxyCODONE  (Oxy IR/ROXICODONE ) immediate release tablet 5 mg Ordered Dose -- Insufficient frequency information   oxyCODONE  (ROXICODONE ) 5 MG/5ML solution 5 mg Ordered Dose -- Insufficient frequency information           Stable post-op images  Cultures from in office aspiration (see progress note from 1/22) - MRSA Cultures from surgery showing gram positive cocci.   ASSESSMENT: Roy Caldwell is a 69 y.o. male POD#2  PLAN: Weightbearing: NWB LUE Insicional and dressing care: Dressings left intact until follow-up Orthopedic device(s): Wound vac. Hemovac removed today. Sling Showering: Hold for now VTE prophylaxis: Resume home meds Pain control: PRN pain medication, minimize narcotics as able ABLA: Hgb 8.2 today. Hemodynamically stable Prosthetic joint infection: WBC trending down. Plan to discharge on Daptomycin . Appreciate ID assistance with this patient.   Follow - up plan: next week in office  Dispo: once patient gets training from home health nurse regarding PICC line and also discharge meds have been met he will discharge home with his wife. Plan to discharge home today.  Contact information:  Weekdays 8-5 Dr. Bonner Hair, Aleck Stalling PA-C, After hours and holidays please check [email or web address] for group call information for [facility]  Aleck Stalling, PA-C 12/03/24   "

## 2024-12-03 NOTE — Progress Notes (Signed)
 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 Cristy on 09/22/2024 then began to have progressively worsening left  shoulder pain.  On 1/15 he received IM injection   In Ortho clinic on 1/19 patient underwent ultrasound-guided aspiration of possible fluid collection of the left shoulder yielding 22 cc of purulent drainage.  Cultures have grown Staph aureus with sensitivities pending.  US  showed well-circumscribed size hypoechoic fluid collection that appears superficial to the pectoralis major muscle.Taken to the OR for procedure above.  ID engaged for antibiotic recommendations - Of note patient had a ED visit on 1/13 for itchy rash, noted he has done some plugging on a house recently with rat droppings.  He was given epinephrine  and prednisone .  WBC 15K at that time. -Amoxicillin  was filled on 11/29/2018 - Today patient states that index replacement was back in 2024 with Ortho in [facility]: [facility] Rhonda W.  He has had interventions since that time. - Cultures from aspiration end of office showing Staph aureus resistant to oxacillin, sensitive to everything else.  Sensitivities in progress note on 12/02/2024 Recommendations: - D/C vancomycin . Start daptomycin  based on clinic cx - Anticipate 6 weeks of IV antibiotics followed by p.o. suppression(given MRSA return hardware suspect he will need indefinite suppression).  I discussed this with patient and wife at bedside. -Follow OR cx - Standard  precautions -Plan communicated primary -ID will sign off  OPAT ORDERS:  Diagnosis: left shoulder PJI  Culture Result: MRSA  Allergies[1]   Discharge antibiotics to be given via PICC line:Daptomycin  600 mg IV every 24 hours   Per pharmacy protocol    Duration: 6 weeks from OR End Date: Daptomycin  600 mg IV every 24 hours  01/12/25   [facility] Per Protocol with [employer] Use: Home health RN for IV administration and teaching, line care and labs.    Labs weekly while on IV antibiotics: _x_ CBC with differential __ BMP **TWICE WEEKLY ON VANCOMYCIN   x__ CMP __x CRP x__ ESR __ Vancomycin  trough TWICE WEEKLY x__ CK  _ Please pull PIC at completion of IV antibiotics _x_ Please leave PIC in place until doctor has seen patient or been notified  Fax weekly labs to [phone]  Clinic Follow Up Appt: 12/29/24  @ [facility] with Dr. Dennise    Microbiology:   Antibiotics: Vancomycin  1/21-   Cultures: Blood   Urine   Other OR cultures 01/21-Gram stain GPC reincubating  fungal Cx 1/21 SUBJECTIVE: Resting in bed.  Wife at bedside. Discussed plan. Interval: Afebrile overnight.  Review of Systems: Review of Systems  All other systems reviewed and are negative.    Scheduled Meds:  alfuzosin   10 mg Oral Daily   Chlorhexidine  Gluconate Cloth  6 each Topical Daily   cilostazol   50 mg Oral BID   clopidogrel   75 mg Oral Daily   docusate sodium   100 mg Oral BID   enoxaparin  (LOVENOX ) injection  40 mg Subcutaneous Q24H   ezetimibe   10 mg Oral QPM  finasteride   5 mg Oral Daily   naproxen   250 mg Oral BID WC   pantoprazole   40 mg Oral Daily   sodium chloride  flush  10-40 mL Intracatheter Q12H   Continuous Infusions:  DAPTOmycin  700 mg (12/02/24 1637)   lactated ringers  Stopped (12/01/24 1832)   PRN Meds:.acetaminophen , alum & mag hydroxide-simeth, bisacodyl , diphenhydrAMINE , famotidine , HYDROmorphone  (DILAUDID ) injection, magnesium  citrate, menthol  **OR** phenol,  methocarbamol  **OR** methocarbamol  (ROBAXIN ) injection, metoCLOPramide  **OR** metoCLOPramide  (REGLAN ) injection, nitroGLYCERIN , ondansetron  **OR** ondansetron  (ZOFRAN ) IV, mouth rinse, oxyCODONE , oxyCODONE , polyethylene glycol, sodium chloride  flush, zolpidem  Allergies[2]  OBJECTIVE: Vitals:   12/02/24 0900 12/02/24 1336 12/02/24 2009 12/03/24 0555  BP: 137/65 125/61 113/61 115/64  Pulse: 62 73 81 71  Resp: 18 20 16 16   Temp: 98.1 F (36.7 C) 97.8 F (36.6 C) 98 F (36.7 C) 98.2 F (36.8 C)  TempSrc:  Oral Oral Oral  SpO2: 97% 98% 98% 98%  Weight:      Height:       Body mass index is 33.28 kg/m.  Physical Exam Constitutional:      General: He is not in acute distress.    Appearance: He is normal weight. He is not toxic-appearing.  HENT:     Head: Normocephalic and atraumatic.     Right Ear: External ear normal.     Left Ear: External ear normal.     Nose: No congestion or rhinorrhea.     Mouth/Throat:     Mouth: Mucous membranes are moist.     Pharynx: Oropharynx is clear.  Eyes:     Extraocular Movements: Extraocular movements intact.     Conjunctiva/sclera: Conjunctivae normal.     Pupils: Pupils are equal, round, and reactive to light.  Cardiovascular:     Rate and Rhythm: Normal rate and regular rhythm.     Heart sounds: No murmur heard.    No friction rub. No gallop.  Pulmonary:     Effort: Pulmonary effort is normal.     Breath sounds: Normal breath sounds.  Abdominal:     General: Abdomen is flat. Bowel sounds are normal.     Palpations: Abdomen is soft.  Musculoskeletal:     Cervical back: Normal range of motion and neck supple.     Comments: Wound vac on shoudler  Skin:    General: Skin is warm and dry.  Neurological:     General: No focal deficit present.     Mental Status: He is oriented to person, place, and time.  Psychiatric:        Mood and Affect: Mood normal.       Lab Results Lab Results  Component Value Date   WBC 13.6 (H)  12/03/2024   HGB 8.2 (L) 12/03/2024   HCT 27.0 (L) 12/03/2024   MCV 86.8 12/03/2024   PLT 337 12/03/2024    Lab Results  Component Value Date   CREATININE 0.71 12/02/2024   BUN 18 12/02/2024   NA 133 (L) 12/02/2024   K 4.6 12/02/2024   CL 102 12/02/2024   CO2 22 12/02/2024    Lab Results  Component Value Date   ALT 14 11/23/2024   AST 17 11/23/2024   ALKPHOS 94 11/23/2024   BILITOT 0.6 11/23/2024        Loney Stank, MD [facility] 12/03/2024, 11:07 AM Evaluation of this patient requires complex antimicrobial therapy evaluation and counseling + isolation needs for disease transmission risk assessment and mitigation       [  1]  Allergies Allergen Reactions   Bee Venom Anaphylaxis and Hives   Shellfish Allergy Anaphylaxis, Hives and Dermatitis    Pt can tolerate shrimp   Statins    Tape Itching    Adhesive  [2]  Allergies Allergen Reactions   Bee Venom Anaphylaxis and Hives   Shellfish Allergy Anaphylaxis, Hives and Dermatitis    Pt can tolerate shrimp   Statins    Tape Itching    Adhesive   "

## 2024-12-04 LAB — FUNGUS CULTURE WITH STAIN

## 2024-12-04 LAB — FUNGUS CULTURE RESULT

## 2024-12-07 ENCOUNTER — Telehealth: Payer: Self-pay

## 2024-12-07 NOTE — Discharge Summary (Signed)
 "  Patient ID: Roy Caldwell MRN: [id number] DOB/AGE: 03-02-1956 69 y.o.  Admit date: 12/01/2024 Discharge date: 12/03/24  Admission Diagnoses: Infection of prosthetic shoulder joint left   Discharge Diagnoses:  Principal Problem:   Prosthetic joint infection   Past Medical History:  Diagnosis Date   Anemia    Aneurysm of left common iliac artery    Arthritis    CAD (coronary artery disease)    Cancer (HCC)    basal cell on scalp   Chronic kidney disease    COPD (chronic obstructive pulmonary disease) (HCC)    Dyspnea    GERD (gastroesophageal reflux disease)    Hearing loss    History of kidney stones    HLD (hyperlipidemia)    HOH (hard of hearing)    Hypertension    Lumbar radiculopathy    Myocardial infarction (HCC)    Neuromuscular disorder (HCC)    radiculopathy, from back and neck   Peripheral vascular disease    Pneumonia    Stroke [facility]) 2021   TIA     Procedures Performed:  Left revision reverse total Shoulder Arthroplasty 76525 Open shoulder synovectomy 23105 I&D shoulder abscess 23040  Discharged Condition: stable  Hospital Course: Patient brought in for scheduled procedure.  Tolerated procedure well.  Was kept for monitoring overnight for pain control, medical monitoring postop, IV antibiotics, and discharge planning. He was found to be stable for DC home on 12/03/24.  Patient was instructed on specific activity restrictions and all questions were answered.   Opiates Today (MME): Today's  total administered Morphine Milligram Equivalents: 0 Opiates Yesterday (MME): Yesterday's total administered Morphine Milligram Equivalents: 0 Opiates Used in the last two days:  Inpatient Morphine Milligram Equivalents Per Day 1/21 - 1/23   Values displayed are in units of MME/Day    Order Start / End Date 1/21 1/22 1/23    oxyCODONE  (Oxy IR/ROXICODONE ) immediate release tablet 5 mg 1/21 - 1/21 7.5 of Unknown -- --    oxyCODONE  (ROXICODONE ) 5 MG/5ML solution  5 mg 1/21 - 1/21 0 of Unknown -- --      Group total: 7.5 of Unknown      HYDROmorphone  (DILAUDID ) injection 0.25-0.5 mg 1/21 - 1/21 40 of 40-80 -- --    meperidine  (DEMEROL ) injection 6.25-12.5 mg 1/21 - 1/21 0 of 7.5-15 -- --    fentaNYL  (SUBLIMAZE ) injection 25-100 mcg 1/21 - 1/21 15 of 7.5-30 -- --    fentaNYL  (SUBLIMAZE ) injection 1/21 - 1/21 *60 of 60 -- --    HYDROmorphone  (DILAUDID ) injection 0.5-1 mg 1/21 - 1/23 0 of 20-40 20 of 60-120 0 of 50-100    oxyCODONE  (Oxy IR/ROXICODONE ) immediate release tablet 5-10 mg 1/21 - 1/23 15 of 15-30 52.5 of 45-90 0 of 37.5-75    oxyCODONE  (Oxy IR/ROXICODONE ) immediate release tablet 10-15 mg 1/21 - 1/23 0 of 30-45 0 of 90-135 15 of 75-112.5    HYDROmorphone  (DILAUDID ) injection 0.5 mg 1/21 - 1/21 0 of 40 -- --    Daily Totals  * 137.5 of Unknown (at least 220-340) 72.5 of 195-345 15 of 162.5-287.5  *One-Step medication  Calculation Errors     Order Type Date Details   oxyCODONE  (Oxy IR/ROXICODONE ) immediate release tablet 5 mg Ordered Dose -- Insufficient frequency information   oxyCODONE  (ROXICODONE ) 5 MG/5ML solution 5 mg Ordered Dose -- Insufficient frequency information            Consults: Infectious disease  Significant Diagnostic Studies: Cultures  Treatments: Surgery  Discharge Exam: General: resting in hospital bed, NAD LUE: wound vac in place with no drainage in canister, hemovac with small amount of drainage, hemovac removed, gauze and tegaderm placed over the opening, sling well fitting,  full and painless ROM throughout hand with DPC of 0. + Motor in  AIN, PIN, Ulnar distributions. Axillary nerve sensation with slight numbness reported.  Sensation intact in medial, radial, and ulnar distributions. Well perfused digits.   Disposition: Discharge disposition: 01-Home or Self Care       Discharge Instructions     [facility] pharmacist to adjust dose for Vancomycin , Aminoglycosides and other anti-infective  therapies as requested by physician.   Complete by: As directed    [facility] to provide Cath Flo 2mg    Complete by: As directed    Administer for PICC line occlusion and as ordered by physician for other access device issues.   Anaphylaxis Kit: Provided to treat any anaphylactic reaction to the medication being provided to the patient if First Dose or when requested by physician   Complete by: As directed    Epinephrine  1mg /ml vial / amp: Administer 0.3mg  (0.21ml) subcutaneously once for moderate to severe anaphylaxis, nurse to call physician and pharmacy when reaction occurs and call 911 if needed for immediate care   Diphenhydramine  50mg /ml IV vial: Administer 25-50mg  IV/IM PRN for first dose reaction, rash, itching, mild reaction, nurse to call physician and pharmacy when reaction occurs   Sodium Chloride  0.9% NS 500ml IV: Administer if needed for hypovolemic blood pressure drop or as ordered by physician after call to physician with anaphylactic reaction   Call MD for:  redness, tenderness, or signs of infection (pain, swelling, redness, odor or green/yellow discharge around incision site)   Complete by: As directed    Call MD for:  severe uncontrolled pain   Complete by: As directed    Call MD for:  temperature >100.4   Complete by: As directed    Change dressing on IV access line weekly and PRN   Complete by: As directed    Diet - low sodium heart healthy   Complete by: As directed    Flush IV access with Sodium Chloride  0.9% and Heparin  10 units/ml or 100 units/ml   Complete by: As directed    Home infusion instructions - [facility]   Complete by: As directed    Instructions: Flush IV access with Sodium Chloride  0.9% and Heparin  10units/ml or 100units/ml   Change dressing on IV access line: Weekly and PRN   Instructions Cath Flo 2mg : Administer for PICC Line occlusion and as ordered by physician for other access device   [facility] pharmacist to  adjust dose for: Vancomycin , Aminoglycosides and other anti-infective therapies as requested by physician   Method of administration may be changed at the discretion of home infusion pharmacist based upon assessment of the patient and/or caregivers ability to self-administer the medication ordered   Complete by: As directed       Allergies as of 12/03/2024       Reactions   Bee Venom Anaphylaxis, Hives   Shellfish Allergy Anaphylaxis, Hives, Dermatitis   Pt can tolerate shrimp   Statins    Tape Itching   Adhesive        Medication List     STOP taking these medications    amoxicillin  500 MG capsule Commonly known as: AMOXIL    amoxicillin  500 MG tablet Commonly known as: AMOXIL   TAKE these medications    Acetaminophen  Extra Strength 500 MG Tabs Take 1,000 mg by mouth every 8 (eight) hours as needed.   alfuzosin  10 MG 24 hr tablet Commonly known as: UROXATRAL  Take 10 mg by mouth daily.   aspirin  EC 81 MG tablet Take 81 mg by mouth daily. Swallow whole.   cetirizine  10 MG tablet Commonly known as: ZyrTEC  Allergy Take 1 tablet (10 mg total) by mouth daily.   cilostazol  50 MG tablet Commonly known as: PLETAL  Take 50 mg by mouth 2 (two) times daily.   clopidogrel  75 MG tablet Commonly known as: PLAVIX  Take 75 mg by mouth daily.   cyanocobalamin 1000 MCG tablet Take 1,000 mcg by mouth daily.   daptomycin  IVPB Commonly known as: CUBICIN  Inject 600 mg into the vein daily. Indication:  MRSA L-shoulder PJI First Dose: Yes Last Day of Therapy:  01/12/25 Labs - Once weekly:  CBC/D, BMP, and CPK Labs - Once weekly: ESR and CRP Method of administration: IV Push Method of administration may be changed at the discretion of home infusion pharmacist based upon assessment of the patient and/or caregiver's ability to self-administer the medication ordered.   diclofenac  75 MG EC tablet Commonly known as: VOLTAREN  Take 1 tablet (75 mg total) by mouth 2 (two) times  daily.   EPINEPHrine  0.3 mg/0.3 mL Soaj injection Commonly known as: EPI-PEN Inject 0.3 mg into the muscle as needed for anaphylaxis.   ezetimibe  10 MG tablet Commonly known as: ZETIA  Take 10 mg by mouth every evening.   famotidine  20 MG tablet Commonly known as: PEPCID  Take 1 tablet (20 mg total) by mouth 2 (two) times daily. What changed:  when to take this reasons to take this   finasteride  5 MG tablet Commonly known as: PROSCAR  Take 5 mg by mouth daily.   hydrocortisone 2.5 % cream Apply 1 Application topically daily as needed (Itching on face).   ketoconazole 2 % cream Commonly known as: NIZORAL Apply 1 Application topically daily as needed for irritation.   methocarbamol  500 MG tablet Commonly known as: ROBAXIN  Take 1 tablet (500 mg total) by mouth every 8 (eight) hours as needed for muscle spasms.   naloxone 4 MG/0.1ML Liqd nasal spray kit Commonly known as: NARCAN Place 1 spray into the nose once.   nitroGLYCERIN  0.4 MG SL tablet Commonly known as: NITROSTAT  Place 0.4 mg under the tongue every 5 (five) minutes as needed for chest pain.   omeprazole 20 MG capsule Commonly known as: PRILOSEC Take 20 mg by mouth 2 (two) times daily before a meal.   oxyCODONE -acetaminophen  10-325 MG tablet Commonly known as: PERCOCET Take 1 tablet by mouth every 4 (four) hours as needed for pain. for pain   sildenafil 20 MG tablet Commonly known as: REVATIO Take 20 mg by mouth daily as needed (ED).   Vitamin D3 125 MCG (5000 UT) Tabs Take 5,000 Units by mouth daily.               Discharge Care Instructions  (From admission, onward)           Start     Ordered   12/03/24 0000  Change dressing on IV access line weekly and PRN  (Home infusion instructions - [facility] )        12/03/24 0653            Contact information for follow-up providers     [facility] [place] (D[facility] Follow  up.   Specialty: DME  Services Why: Agency will provide IV antibiotics Contact information: [address] [place]  [address] [phone]             Contact information for after-discharge care     Home Medical Care     [facility]) .   Service: Home Health Services Why: Agency will provide home health RN Contact information: [address] [place]  [address] [phone]                     Aleck Stalling, PA-C   "

## 2024-12-07 NOTE — Telephone Encounter (Signed)
 Received voicemail from [place] with Hedda stating patient told her his road is a hill covered in ice and he would like her to wait until at least tomorrow before she tries to see him for a visit.   Spoke with Powell, patient's PICC dressing was last changed on 1/22 so he does not need it changed until 1/29. Powell will try to go out tomorrow for labs and dressing change, but it may be Thursday at the latest.   Routing to pharmacy and provider regarding potential delay in labs.   Heather [phone]  Duwaine Lowe, BSN, RN

## 2024-12-15 ENCOUNTER — Encounter (HOSPITAL_COMMUNITY): Payer: Self-pay

## 2024-12-15 ENCOUNTER — Other Ambulatory Visit: Payer: Self-pay

## 2024-12-15 ENCOUNTER — Encounter (HOSPITAL_COMMUNITY)
Admission: RE | Admit: 2024-12-15 | Discharge: 2024-12-15 | Disposition: A | Source: Ambulatory Visit | Attending: Orthopaedic Surgery | Admitting: Orthopaedic Surgery

## 2024-12-15 DIAGNOSIS — Z01818 Encounter for other preprocedural examination: Secondary | ICD-10-CM

## 2024-12-15 LAB — AEROBIC/ANAEROBIC CULTURE W GRAM STAIN (SURGICAL/DEEP WOUND): Gram Stain: NONE SEEN

## 2024-12-15 NOTE — Progress Notes (Addendum)
 Anesthesia Review:  PCP: Franky Molt  Cardiologist : Darice Sharps  LOV 07/01/24   PPM/ ICD: Device Orders: Rep Notified:  Chest x-ray : EKG : 07/02/24  Echo : Stress test: Cardiac Cath :   Activity level: can do a flight of stairs wtihout difficulty  Sleep Study/ CPAP : none  Fasting Blood Sugar :      / Checks Blood Sugar -- times a day:    Blood Thinner/ Instructions /Last Dose: ASA / Instructions/ Last Dose :    81 mg aspirin   Plavix - last dose on 12/13/2024 per pt    Preop phone call with med hx and preop instructons completed with pt on 12/15/24.  PT has hibiclens  at home.  PT instructed at end of phone call to call Admitting at [phone].  PT voiced understanding.    12/02/24-bmp  12/03/24- cbc - routed to DR Cristy on 12/15/24 with hgb of 8.2  Made [name] aware on 12/15/2024.  PA will review chart .   Last admission on 12/01/24. With discharge on 12/03/24.     PICC line

## 2024-12-15 NOTE — Patient Instructions (Addendum)
 SURGICAL WAITING ROOM VISITATION  Patients having surgery or a procedure may have no more than 2 support people in the waiting area - these visitors may rotate.    Children ages 86 and under will not be able to visit patients in [facility] under most circumstances.   Visitors with respiratory illnesses are discouraged from visiting and should remain at home.  If the patient needs to stay at the hospital during part of their recovery, the visitor guidelines for inpatient rooms apply. Pre-op nurse will coordinate an appropriate time for 1 support person to accompany patient in pre-op.  This support person may not rotate.    Please refer to the [facility] website for the visitor guidelines for Inpatients (after your surgery is over and you are in a regular room).       Your procedure is scheduled on: 12/17/2024    Report to [facility] Main Entrance    Report to admitting at    [phone]   Call this number if you have problems the morning of surgery [phone]   Do not eat food   :After Midnight.   After Midnight you may have the following liquids until __ 0830____ AM DAY OF SURGERY  Water  Non-Citrus Juices (without pulp, NO RED-Apple, White grape, White cranberry) Black Coffee (NO MILK/CREAM OR CREAMERS, sugar ok)  Clear Tea (NO MILK/CREAM OR CREAMERS, sugar ok) regular and decaf                             Plain Jell-O (NO RED)                                           Fruit ices (not with fruit pulp, NO RED)                                     Popsicles (NO RED)                                                               Sports drinks like Gatorade (NO RED)                           If you have questions, please contact your surgeons office.      Oral Hygiene is also important to reduce your risk of infection.                                    Remember - BRUSH YOUR TEETH THE MORNING OF SURGERY WITH YOUR REGULAR TOOTHPASTE  DENTURES WILL BE REMOVED  PRIOR TO SURGERY PLEASE DO NOT APPLY Poly grip OR ADHESIVES!!!   Do NOT smoke after Midnight   Stop all vitamins and herbal supplements 7 days before surgery.   Take these medicines the morning of surgery with A SIP OF WATER : proscar , omeprazole, uroxatral , , oxycodone  if needed   DO NOT TAKE ANY ORAL DIABETIC MEDICATIONS DAY OF YOUR  SURGERY  Bring CPAP mask and tubing day of surgery.                              You may not have any metal on your body including hair pins, jewelry, and body piercing             Do not wear make-up, lotions, powders, perfumes/cologne, or deodorant  Do not wear nail polish including gel and S&S, artificial/acrylic nails, or any other type of covering on natural nails including finger and toenails. If you have artificial nails, gel coating, etc. that needs to be removed by a nail salon please have this removed prior to surgery or surgery may need to be canceled/ delayed if the surgeon/ anesthesia feels like they are unable to be safely monitored.   Do not shave  48 hours prior to surgery.               Men may shave face and neck.   Do not bring valuables to the hospital. [facility] IS NOT             RESPONSIBLE   FOR VALUABLES.   Contacts, glasses, dentures or bridgework may not be worn into surgery.   Bring small overnight bag day of surgery.   DO NOT BRING YOUR HOME MEDICATIONS TO THE HOSPITAL. PHARMACY WILL DISPENSE MEDICATIONS LISTED ON YOUR MEDICATION LIST TO YOU DURING YOUR ADMISSION IN THE HOSPITAL!    Patients discharged on the day of surgery will not be allowed to drive home.  Someone NEEDS to stay with you for the first 24 hours after anesthesia.   Special Instructions: Bring a copy of your healthcare power of attorney and living will documents the day of surgery if you haven't scanned them before.              Please read over the following fact sheets you were given: IF YOU HAVE QUESTIONS ABOUT YOUR PRE-OP INSTRUCTIONS PLEASE CALL  [phone].   If you received a COVID test during your pre-op visit  it is requested that you wear a mask when out in public, stay away from anyone that may not be feeling well and notify your surgeon if you develop symptoms. If you test positive for Covid or have been in contact with anyone that has tested positive in the last 10 days please notify you surgeon.    [place] - Preparing for Surgery     Before surgery, you can play an important role.  Because skin is not sterile, your skin needs to be as free of germs as possible.  You can reduce the number of germs on your skin by washing with CHG (chlorahexidine gluconate) soap before surgery.  CHG is an antiseptic cleaner which kills germs and bonds with the skin to continue killing germs even after washing. Please DO NOT use if you have an allergy to CHG or antibacterial soaps.  If your skin becomes reddened/irritated stop using the CHG and inform your nurse when you arrive at Short Stay. Do not shave (including legs and underarms) for at least 48 hours prior to the first CHG shower.  You may shave your face/neck.  Please follow these instructions carefully:               PT aware to keep PICC line dry as he has been doing   1.  Shower with CHG Soap the night before surgery  ONLY (DO NOT USE THE SOAP THE MORNING OF SURGERY).          2.  If you choose to wash your hair, wash your hair first as usual with your normal  shampoo.  3.  After you shampoo, rinse your hair and body thoroughly to remove the shampoo.                             4.  Use CHG as you would any other liquid soap.  You can apply chg directly to the skin and wash.  Gently with a scrungie or clean washcloth.  5.  Apply the CHG Soap to your body ONLY FROM THE NECK DOWN.   Do   not use on face/ open                           Wound or open sores. Avoid contact with eyes, ears mouth and   genitals (private parts).                       Wash face,  Genitals (private parts) with your  normal soap.             6.  Wash thoroughly, paying special attention to the area where your    surgery  will be performed.  7.  Thoroughly rinse your body with warm water  from the neck down.  8.  DO NOT shower/wash with your normal soap after using and rinsing off the CHG Soap.                9.  Pat yourself dry with a clean towel.            10.  Wear clean pajamas.            11.  Place clean sheets on your bed the night of your first shower and do not  sleep with pets. Day of Surgery : Do not apply any CHG, lotions/deodorants the morning of surgery.  Please wear clean clothes to the hospital/surgery center.  FAILURE TO FOLLOW THESE INSTRUCTIONS MAY RESULT IN THE CANCELLATION OF YOUR SURGERY  PATIENT SIGNATURE_________________________________  NURSE SIGNATURE__________________________________  ________________________________________________________________________

## 2024-12-15 NOTE — H&P (Signed)
 "   PREOPERATIVE H&P  Chief Complaint: Deep postoperative wound infection  HPI: Roy Caldwell is a 69 y.o. male who is scheduled for, Procedures: INCISION AND DRAINAGE OF DEEP ABSCESS, SHOULDER.   Patient has a past medical history significant for MI, GERD, COPD, PVD, TIA.   Patient continues to have purulent drainage from his incision. He had X-rays which showed no new signs of dislocation. He has had no fever, no chills, no redness, and has been on IV antibiotics  Symptoms are rated as moderate to severe, and have been worsening.  This is significantly impairing activities of daily living.    Please see clinic note for further details on this patient's care.    He has elected for surgical management.   Past Medical History:  Diagnosis Date   Anemia    Aneurysm of left common iliac artery    Arthritis    CAD (coronary artery disease)    Cancer (HCC)    basal cell on scalp   Chronic kidney disease    COPD (chronic obstructive pulmonary disease) (HCC)    Dyspnea    GERD (gastroesophageal reflux disease)    Hearing loss    History of kidney stones    HLD (hyperlipidemia)    HOH (hard of hearing)    Hypertension    Lumbar radiculopathy    Myocardial infarction (HCC)    Neuromuscular disorder (HCC)    radiculopathy, from back and neck   Peripheral vascular disease    Pneumonia    Stroke (HCC) 2021   TIA   Past Surgical History:  Procedure Laterality Date   BACK SURGERY     CARDIAC CATHETERIZATION  2022   DES x 2,  done at [facility] regional   by Dr. Wadie Counter   CARPAL TUNNEL RELEASE Bilateral    EYE SURGERY     FB removal   HERNIA REPAIR     ;umbilical   IRRIGATION AND DEBRIDEMENT SHOULDER Left 12/01/2024   Procedure: IRRIGATION AND DEBRIDEMENT SHOULDER;  Surgeon: Cristy Bonner DASEN, MD;  Location: [facility];  Service: Orthopedics;  Laterality: Left;   JOINT REPLACEMENT Right 2015   total shoulder done by Dr. Lewanda at [facility]   OPEN TREATMENT, DISLOCATION, DISTAL  RADIOULNAR JOINT Left 07/26/2024   Procedure: OPEN TREATMENT, DISLOCATION;  Surgeon: Cristy Bonner DASEN, MD;  Location: [facility];  Service: Orthopedics;  Laterality: Left;  ADD CODE [id number] (ANTERIOR CAPSULE ORBITER)   ORIF SHOULDER FRACTURE Left 07/07/2024   Procedure: OPEN REDUCTION INTERNAL FIXATION (ORIF) SHOULDER FRACTURE;  Surgeon: Cristy Bonner DASEN, MD;  Location: [facility];  Service: Orthopedics;  Laterality: Left;   REPLACEMENT TOTAL KNEE Bilateral    right  2015   left  2023   REVISION TOTAL SHOULDER TO REVERSE TOTAL SHOULDER Left 07/07/2024   Procedure: REVISION, REVERSE TOTAL ARTHROPLASTY, SHOULDER;  Surgeon: Cristy Bonner DASEN, MD;  Location: [facility];  Service: Orthopedics;  Laterality: Left;   REVISION TOTAL SHOULDER TO REVERSE TOTAL SHOULDER Left 07/26/2024   Procedure: REVISION, REVERSE TOTAL ARTHROPLASTY, SHOULDER;  Surgeon: Cristy Bonner DASEN, MD;  Location: [facility];  Service: Orthopedics;  Laterality: Left;   REVISION TOTAL SHOULDER TO REVERSE TOTAL SHOULDER Left 10/04/2024   Procedure: REVISION, REVERSE TOTAL ARTHROPLASTY, SHOULDER;  Surgeon: Cristy Bonner DASEN, MD;  Location: [facility];  Service: Orthopedics;  Laterality: Left;   REVISION TOTAL SHOULDER TO REVERSE TOTAL SHOULDER Left 12/01/2024   Procedure: REVISION, REVERSE TOTAL ARTHROPLASTY, SHOULDER;  Surgeon: Cristy Bonner DASEN, MD;  Location: [facility];  Service: Orthopedics;  Laterality: Left;  REVISION TOTAL SHOULDER ARTHROPLASTY. ARTHROTOMY GNEOHUMERAL JOINT   SHOULDER OPEN ROTATOR CUFF REPAIR Left 10/04/2024   Procedure: REPAIR, ROTATOR CUFF, OPEN;  Surgeon: Cristy Bonner DASEN, MD;  Location: [facility];  Service: Orthopedics;  Laterality: Left;  open treatment of shoulder dislocation   SYNOVECTOMY Left 07/26/2024   Procedure: SYNOVECTOMY;  Surgeon: Cristy Bonner DASEN, MD;  Location: [facility];  Service: Orthopedics;  Laterality: Left;   SYNOVECTOMY Left 10/04/2024   Procedure: SYNOVECTOMY;  Surgeon: Cristy Bonner DASEN, MD;  Location: [facility];  Service: Orthopedics;  Laterality:  Left;   SYNOVECTOMY Left 12/01/2024   Procedure: SYNOVECTOMY;  Surgeon: Cristy Bonner DASEN, MD;  Location: [facility];  Service: Orthopedics;  Laterality: Left;   TOTAL SHOULDER REPLACEMENT Left 2023   done at [facility]  by Dr. Lewanda   WRIST FUSION Left    Social History   Socioeconomic History   Marital status: Married    Spouse name: Not on file   Number of children: Not on file   Years of education: Not on file   Highest education level: Not on file  Occupational History   Not on file  Tobacco Use   Smoking status: Former    Types: Cigarettes    Start date: 2023    Quit date: 1973    Years since quitting: 53.1   Smokeless tobacco: Never  Vaping Use   Vaping status: Never Used  Substance and Sexual Activity   Alcohol use: Not Currently   Drug use: Not Currently   Sexual activity: Not Currently  Other Topics Concern   Not on file  Social History Narrative   Not on file   Social Drivers of Health   Tobacco Use: Medium Risk (12/01/2024)   Patient History    Smoking Tobacco Use: Former    Smokeless Tobacco Use: Never    Passive Exposure: Not on Actuary Strain: Not on file  Food Insecurity: No Food Insecurity (12/01/2024)   Epic    Worried About Programme Researcher, Broadcasting/film/video in the Last Year: Never true    Ran Out of Food in the Last Year: Never true  Transportation Needs: No Transportation Needs (12/01/2024)   Epic    Lack of Transportation (Medical): No    Lack of Transportation (Non-Medical): No  Physical Activity: Not on file  Stress: Not on file  Social Connections: Socially Integrated (12/01/2024)   Social Connection and Isolation Panel    Frequency of Communication with Friends and Family: Twice a week    Frequency of Social Gatherings with Friends and Family: Once a week    Attends Religious Services: More than 4 times per year    Active Member of [employer] or Organizations: No    Attends Engineer, Structural: More than 4 times per year    Marital Status:  Married  Depression (PHQ2-9): Not on file  Alcohol Screen: Not on file  Housing: Low Risk (12/01/2024)   Epic    Unable to Pay for Housing in the Last Year: No    Number of Times Moved in the Last Year: 0    Homeless in the Last Year: No  Utilities: Not At Risk (12/01/2024)   Epic    Threatened with loss of utilities: No  Health Literacy: Not on file   No family history on file. Allergies[1] Prior to Admission medications  Medication Sig Start Date End Date Taking? Authorizing Provider  Acetaminophen  Extra  Strength 500 MG TABS Take 1,000 mg by mouth every 8 (eight) hours as needed (pain). 11/25/24  Yes [provider]  alfuzosin  (UROXATRAL ) 10 MG 24 hr tablet Take 10 mg by mouth daily.   Yes [provider]  cetirizine  (ZYRTEC  ALLERGY) 10 MG tablet Take 1 tablet (10 mg total) by mouth daily. 11/27/24  Yes Lenor Hollering, MD  Cholecalciferol (VITAMIN D3) 125 MCG (5000 UT) TABS Take 5,000 Units by mouth daily.   Yes [provider]  cilostazol  (PLETAL ) 50 MG tablet Take 50 mg by mouth 2 (two) times daily. 04/07/24 04/07/25 Yes [provider]  clopidogrel  (PLAVIX ) 75 MG tablet Take 75 mg by mouth daily.   Yes [provider]  cyanocobalamin 1000 MCG tablet Take 1,000 mcg by mouth daily.   Yes [provider]  daptomycin  (CUBICIN ) IVPB Inject 600 mg into the vein daily. Indication:  MRSA L-shoulder PJI First Dose: Yes Last Day of Therapy:  01/12/25 Labs - Once weekly:  CBC/D, BMP, and CPK Labs - Once weekly: ESR and CRP Method of administration: IV Push Method of administration may be changed at the discretion of home infusion pharmacist based upon assessment of the patient and/or caregiver's ability to self-administer the medication ordered. 12/03/24 01/13/25 Yes Junetta Hearn, Aleck SAILOR, PA-C  diclofenac  (VOLTAREN ) 75 MG EC tablet Take 1 tablet (75 mg total) by mouth 2 (two) times daily. 07/27/24  Yes Jahquez Steffler, Aleck SAILOR, PA-C  EPINEPHrine  0.3 mg/0.3  mL IJ SOAJ injection Inject 0.3 mg into the muscle as needed for anaphylaxis. 11/23/24  Yes Long, Fonda MATSU, MD  ezetimibe  (ZETIA ) 10 MG tablet Take 10 mg by mouth every evening.   Yes [provider]  famotidine  (PEPCID ) 20 MG tablet Take 1 tablet (20 mg total) by mouth 2 (two) times daily. Patient taking differently: Take 20 mg by mouth 2 (two) times daily as needed for heartburn. 11/27/24  Yes Lenor Hollering, MD  finasteride  (PROSCAR ) 5 MG tablet Take 5 mg by mouth daily.   Yes [provider]  hydrocortisone 2.5 % cream Apply 1 Application topically daily as needed (Itching on face). 10/01/24  Yes [provider]  ketoconazole (NIZORAL) 2 % cream Apply 1 Application topically daily as needed for irritation. 11/13/24  Yes [provider]  methocarbamol  (ROBAXIN ) 500 MG tablet Take 1 tablet (500 mg total) by mouth every 8 (eight) hours as needed for muscle spasms. 10/05/24  Yes Bertram Haddix N, PA-C  nitroGLYCERIN  (NITROSTAT ) 0.4 MG SL tablet Place 0.4 mg under the tongue every 5 (five) minutes as needed for chest pain.   Yes [provider]  oxyCODONE -acetaminophen  (PERCOCET) 10-325 MG tablet Take 1 tablet by mouth every 4 (four) hours as needed for pain. for pain   Yes [provider]  aspirin  EC 81 MG tablet Take 81 mg by mouth daily. Swallow whole.    [provider]  naloxone [facility]) nasal spray 4 mg/0.1 mL Place 1 spray into the nose once.    [provider]  omeprazole (PRILOSEC) 20 MG capsule Take 20 mg by mouth 2 (two) times daily before a meal. 09/17/17   [provider]  sildenafil (REVATIO) 20 MG tablet Take 20 mg by mouth daily as needed (ED).    [provider]    ROS: All other systems have been reviewed and were otherwise negative with the exception of those mentioned in the HPI and as above.  Physical Exam: General: Alert, no acute distress Cardiovascular: No pedal  edema Respiratory: No  cyanosis, no use of accessory musculature GI: No organomegaly, abdomen is soft and non-tender Skin: No lesions in the area of chief complaint Neurologic: Sensation intact distally Psychiatric: Patient is competent for consent with normal mood and affect Lymphatic: No axillary or cervical lymphadenopathy  MUSCULOSKELETAL:  No range of motion tested to the shoulder. Incision's benign currently, but some mild thick drainage noted. No expressible drainage noted.   Imaging:  1 view xray of the left shoulder demonstrates appropriate position of the left reverse total shoulder arthroplasty without any signs of dislocation  Assessment: Deep postoperative wound infection  Plan: Plan for Procedures: INCISION AND DRAINAGE OF DEEP ABSCESS, SHOULDER  The risks benefits and alternatives were discussed with the patient including but not limited to the risks of nonoperative treatment, versus surgical intervention including infection, bleeding, nerve injury,  blood clots, cardiopulmonary complications, morbidity, mortality, among others, and they were willing to proceed.   The patient acknowledged the explanation, agreed to proceed with the plan and consent was signed.   Operative Plan: I&D left shoulder abscess Discharge Medications: standard DVT Prophylaxis: resume Physical Therapy: delayed Special Discharge needs: +/-   Aleck LOISE Stalling, PA-C  12/15/2024 10:08 AM     [1]  Allergies Allergen Reactions   Bee Venom Anaphylaxis and Hives   Shellfish Allergy Anaphylaxis, Hives and Dermatitis    Pt can tolerate shrimp   Statins    Tape Itching    Adhesive   "

## 2024-12-16 ENCOUNTER — Encounter (HOSPITAL_COMMUNITY): Payer: Self-pay

## 2024-12-16 LAB — AEROBIC/ANAEROBIC CULTURE W GRAM STAIN (SURGICAL/DEEP WOUND)

## 2024-12-16 NOTE — Progress Notes (Signed)
 " Case: [id number] Date/Time: 12/17/24 1130   Procedure: INCISION AND DRAINAGE OF DEEP ABSCESS, SHOULDER (Left)   Anesthesia type: General   Diagnosis: Deep postoperative wound infection [T81.42XA]   Pre-op diagnosis: Deep postoperative wound infection   Location: [facility]OM 01 / [facility]   Surgeons: Cristy Bonner DASEN, MD       DISCUSSION: Roy Caldwell is a 69 yo male with PMH of former smoking, COPD, HTN, hx of MI, CAD s/p DES to LAD and RCA (05/2021), PAD s/p balloon angioplasty of Left SFA (01/2018), aneurysm of left common iliac artery, hx of TIA (2021), GERD, arthritis, hx of ACDF C3-5, hx of lumbar fusion T10-S1  Patient had left shoulder replacement in 08/2023. Has had multiple complications and had revision in 06/2024, 07/2024, 09/2024, 11/2024, and now scheduled for I&D.  Last surgery on 12/01/24. Seen by ID while inpatient. Advised to start Daptomycin  and would need 6 weeks IV abx via PICC followed by PO suppression.   Follows with Cardiology at [facility]. Last seen by Dr. Claudene on 07/01/2024. Stable at this visit, no cardiac symptoms. Lexiscan 03/24/2024 showed small apical/apical septal infarct, no ischemia, normal LV wall motion, EF 76%.  Advised to follow up in 6 months. Per cardiology ok to hold Plavix  5 days prior to surgery.      Follows with Vascular surgery at [facility] for iliac aneurysm and PAD. Last seen on 11/15/23 by Dr. Myra. On DAPT and no claudication symptoms reported. Right common iliac artery aneurysm not seen on duplex in office.  Previously was 2.9 cm, on duplex there was no measurement greater than 1.5 cm.  He has a left common iliac artery aneurysm that is stable at 2.6 cm. Advised f/u in 6 months.    VS:     12/03/2024    2:40 PM 12/03/2024    5:55 AM 12/02/2024    8:09 PM  Vitals with BMI  Systolic 98 115 113  Diastolic 66 64 61  Pulse 80 71 81     PROVIDERS: Joshua Franky Sharper, PA-C   LABS: Labs reviewed: Acceptable for surgery. Slowly worsening anemia.  Consider repeat CBC DOS  (all labs ordered are listed, but only abnormal results are displayed)  Labs Reviewed - No data to display   IMAGES: US  Aorta Iliac Vessels Doppler Complete 10/14/24 ([facility])  Conclusion Technically difficult study due to body habitus and bowel gas. No evidence of significant dilatation of the abdominal aorta is noted. Right  Common iliac artery aneurysm not visualized today. Stenosis noted. Left  2.5 x 2.6 cm iliac artery aneurysm. Stenosis noted.    EKG 07/02/24:  NSR     Stress Test 03/24/2024 ([facility]):  1.  Small apical/apicoseptal infarct.  No ischemia..   2. Normal left ventricular wall motion.   3. Left ventricular ejection fraction 76%   4. Non invasive risk stratification*: Low   Event monitoring 03/24/24 ([facility]):  Narrative This result has an attachment that is not available. FINAL PROVIDER INTERPRETATION Agree with Findings.  Frequent PACs  5.9%  PRELIMINARY FINDINGS Patient had a min HR of 45 bpm, max HR of 119 bpm, and avg HR of 68 bpm. Predominant underlying rhythm was Sinus Rhythm. 1 run of Supraventricular Tachycardia occurred lasting 7 beats with a max rate of 106 bpm  avg 100 bpm . Isolated SVEs were frequent  5.9%, 18487 , SVE Couplets were rare  <1.0%, 44 , and SVE Triplets were rare  <1.0%, 2 . Isolated VEs were rare  <  1.0% , VE Couplets were rare  <1.0% , and no VE Triplets were present.  Echo 06/28/2021 ([facility]):  SUMMARY CPS 04/07/20 NSC. Left ventricular systolic function is low normal. LV ejection fraction = 50-55%. Left ventricular filling pattern is prolonged relaxation. There is basal LV inferior wall hypokinesis There is mid LV inferior wall hypokinesis The left atrium is mildly dilated. There was insufficient TR detected to calculate RV systolic pressure.   Past Medical History:  Diagnosis Date   Anemia    Aneurysm of left common iliac artery    Arthritis    CAD (coronary artery disease)    Cancer (HCC)     basal cell on scalp   GERD (gastroesophageal reflux disease)    Hearing loss    History of kidney stones    HLD (hyperlipidemia)    HOH (hard of hearing)    Hypertension    Lumbar radiculopathy    Myocardial infarction [facility])    Neuromuscular disorder (HCC)    radiculopathy, from back and neck   Pneumonia    Stroke (HCC) 2021   TIA    Past Surgical History:  Procedure Laterality Date   BACK SURGERY     CARDIAC CATHETERIZATION  2022   DES x 2,  done at [facility]   by Dr. Wadie Counter   CARPAL TUNNEL RELEASE Bilateral    EYE SURGERY     FB removal   HERNIA REPAIR     ;umbilical   IRRIGATION AND DEBRIDEMENT SHOULDER Left 12/01/2024   Procedure: IRRIGATION AND DEBRIDEMENT SHOULDER;  Surgeon: Cristy Bonner DASEN, MD;  Location: [facility];  Service: Orthopedics;  Laterality: Left;   JOINT REPLACEMENT Right 2015   total shoulder done by Dr. Lewanda at [facility]   neck fusion     OPEN TREATMENT, DISLOCATION, DISTAL RADIOULNAR JOINT Left 07/26/2024   Procedure: OPEN TREATMENT, DISLOCATION;  Surgeon: Cristy Bonner DASEN, MD;  [facility];  Service: Orthopedics;  Laterality: Left;  ADD CODE [id number] (ANTERIOR CAPSULE ORBITER)   ORIF SHOULDER FRACTURE Left 07/07/2024   Procedure: OPEN REDUCTION INTERNAL FIXATION (ORIF) SHOULDER FRACTURE;  Surgeon: Cristy Bonner DASEN, MD;  [facility];  Service: Orthopedics;  Laterality: Left;   REPLACEMENT TOTAL KNEE Bilateral    right  2015   left  2023   REVISION TOTAL SHOULDER TO REVERSE TOTAL SHOULDER Left 07/07/2024   Procedure: REVISION, REVERSE TOTAL ARTHROPLASTY, SHOULDER;  Surgeon: Cristy Bonner DASEN, MD;  [facility];  Service: Orthopedics;  Laterality: Left;   REVISION TOTAL SHOULDER TO REVERSE TOTAL SHOULDER Left 07/26/2024   Procedure: REVISION, REVERSE TOTAL ARTHROPLASTY, SHOULDER;  Surgeon: Cristy Bonner DASEN, MD;  [facility];  Service: Orthopedics;  Laterality: Left;   REVISION TOTAL SHOULDER TO REVERSE TOTAL SHOULDER Left 10/04/2024   Procedure:  REVISION, REVERSE TOTAL ARTHROPLASTY, SHOULDER;  Surgeon: Cristy Bonner DASEN, MD;  [facility];  Service: Orthopedics;  Laterality: Left;   REVISION TOTAL SHOULDER TO REVERSE TOTAL SHOULDER Left 12/01/2024   Procedure: REVISION, REVERSE TOTAL ARTHROPLASTY, SHOULDER;  Surgeon: Cristy Bonner DASEN, MD;  [facility];  Service: Orthopedics;  Laterality: Left;  REVISION TOTAL SHOULDER ARTHROPLASTY. ARTHROTOMY GNEOHUMERAL JOINT   SHOULDER OPEN ROTATOR CUFF REPAIR Left 10/04/2024   Procedure: REPAIR, ROTATOR CUFF, OPEN;  Surgeon: Cristy Bonner DASEN, MD;  [facility];  Service: Orthopedics;  Laterality: Left;  open treatment of shoulder dislocation   SYNOVECTOMY Left 07/26/2024   Procedure: SYNOVECTOMY;  Surgeon: Cristy Bonner DASEN, MD;  Location: [facility];  Service: Orthopedics;  Laterality: Left;   SYNOVECTOMY Left 10/04/2024   Procedure: SYNOVECTOMY;  Surgeon: Cristy Bonner DASEN, MD;  [facility];  Service: Orthopedics;  Laterality: Left;   SYNOVECTOMY Left 12/01/2024   Procedure: SYNOVECTOMY;  Surgeon: Cristy Bonner DASEN, MD;  [facility];  Service: Orthopedics;  Laterality: Left;   TOTAL SHOULDER REPLACEMENT Left 2023   done at [facility]  by Dr. Lewanda   WRIST FUSION Left     MEDICATIONS:  Acetaminophen  Extra Strength 500 MG TABS   alfuzosin  (UROXATRAL ) 10 MG 24 hr tablet   aspirin  EC 81 MG tablet   Cholecalciferol (VITAMIN D3) 125 MCG (5000 UT) TABS   cilostazol  (PLETAL ) 50 MG tablet   clopidogrel  (PLAVIX ) 75 MG tablet   cyanocobalamin 1000 MCG tablet   daptomycin  (CUBICIN ) IVPB   diclofenac  (VOLTAREN ) 75 MG EC tablet   EPINEPHrine  0.3 mg/0.3 mL IJ SOAJ injection   ezetimibe  (ZETIA ) 10 MG tablet   famotidine  (PEPCID ) 20 MG tablet   finasteride  (PROSCAR ) 5 MG tablet   hydrocortisone 2.5 % cream   ketoconazole (NIZORAL) 2 % cream   methocarbamol  (ROBAXIN ) 500 MG tablet   naloxone (NARCAN) nasal spray 4 mg/0.1 mL   nitroGLYCERIN  (NITROSTAT ) 0.4 MG SL tablet   omeprazole (PRILOSEC) 20 MG capsule    oxyCODONE -acetaminophen  (PERCOCET) 10-325 MG tablet   sildenafil (REVATIO) 20 MG tablet   No current facility-administered medications for this encounter.          "

## 2024-12-16 NOTE — Anesthesia Preprocedure Evaluation (Signed)
"                                    Anesthesia Evaluation    Airway        Dental   Pulmonary former smoker          Cardiovascular hypertension,      Neuro/Psych    GI/Hepatic   Endo/Other    Renal/GU      Musculoskeletal   Abdominal   Peds  Hematology   Anesthesia Other Findings   Reproductive/Obstetrics                              Anesthesia Physical Anesthesia Plan  ASA:   Anesthesia Plan:    Post-op Pain Management:    Induction:   PONV Risk Score and Plan:   Airway Management Planned:   Additional Equipment:   Intra-op Plan:   Post-operative Plan:   Informed Consent:   Plan Discussed with:   Anesthesia Plan Comments: (See PAT note from 2/4)         Anesthesia Quick Evaluation  "

## 2024-12-17 ENCOUNTER — Encounter (HOSPITAL_COMMUNITY): Payer: Self-pay | Admitting: Anesthesiology

## 2024-12-17 ENCOUNTER — Ambulatory Visit (HOSPITAL_COMMUNITY): Admission: RE | Admit: 2024-12-17 | Admitting: Orthopaedic Surgery

## 2024-12-17 ENCOUNTER — Encounter (HOSPITAL_COMMUNITY): Admission: RE | Payer: Self-pay

## 2024-12-29 ENCOUNTER — Inpatient Hospital Stay: Payer: Self-pay | Admitting: Internal Medicine
